# Patient Record
Sex: Male | Born: 2000 | Race: White | Hispanic: No | Marital: Single | Smoking: Never smoker
Health system: Southern US, Community
[De-identification: ages and names within clinical notes are randomized; demographics above are authoritative.]

## PROBLEM LIST (undated history)

## (undated) DIAGNOSIS — R569 Unspecified convulsions: Secondary | ICD-10-CM

## (undated) DIAGNOSIS — F840 Autistic disorder: Secondary | ICD-10-CM

## (undated) DIAGNOSIS — T4145XA Adverse effect of unspecified anesthetic, initial encounter: Secondary | ICD-10-CM

## (undated) DIAGNOSIS — Z9889 Other specified postprocedural states: Secondary | ICD-10-CM

## (undated) DIAGNOSIS — Q851 Tuberous sclerosis: Secondary | ICD-10-CM

## (undated) DIAGNOSIS — R112 Nausea with vomiting, unspecified: Secondary | ICD-10-CM

## (undated) DIAGNOSIS — T8859XA Other complications of anesthesia, initial encounter: Secondary | ICD-10-CM

## (undated) HISTORY — PX: MRI: SHX5353

## (undated) SURGERY — Surgical Case
Anesthesia: *Unknown

---
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
## 2018-02-15 ENCOUNTER — Telehealth (INDEPENDENT_AMBULATORY_CARE_PROVIDER_SITE_OTHER): Payer: Self-pay | Admitting: Pediatrics

## 2018-02-15 NOTE — Telephone Encounter (Signed)
Spoke with mom to get more information about Cameron Riley's seizures. She stated that he has had two significant seizures. She states that he grinds his teeth and eyes roll back and his shoulders tense up. They lasted about 45 seconds each and then he slept all night. While at school, he slept for 2 hours. She stated that he did have some blood work done January 13 at [facility] and his levels were fine. Please advise

## 2018-02-15 NOTE — Telephone Encounter (Signed)
As I hope you know, I am not able to contact the family because I am out of town.  See if we can obtain the laboratory that was done at [facility] and let the family know that I will call them when I return on Monday.  If this needs to be dealt with urgently, then Dr. Rogers Blocker will need to talk with the family.

## 2018-02-15 NOTE — Telephone Encounter (Signed)
°  Who's calling (name and relationship to patient) : [name] (Mother) Best contact number: [phone] Provider they see: Dr. Gaynell Face Reason for call: Mom called and would like to speak with Dr. Gaynell Face regarding seizures that her son has been having.

## 2018-02-21 NOTE — Telephone Encounter (Signed)
Cameron Riley had a pair of seizures on Wednesday.  He is lamotrigine level January 05, 2018 at trough was 21.7 mcg/mL which is in the toxic range for the laboratory (2.5-15.0 mcg/mL).  I do not feel comfortable increasing his dose.  Is been 6 months between seizures.  We are going to observe for now.  I have not seen him in a year and I asked his mother to bring him back in June.  We have more seizures were going to have to add another medication.

## 2018-02-21 NOTE — Telephone Encounter (Signed)
Patients mother called in regards to concerns from last week. Stated that she has been waiting for a call back and has not be contacted. She is requesting a call back as soon as possible.

## 2018-02-21 NOTE — Telephone Encounter (Signed)
Spoke with mom to inform her that we did get her message and that Dr. Gaynell Face will give her a call back. Also informed her that I could not find the blood work in [employer]. She is going to fax the results to [place].

## 2018-02-26 ENCOUNTER — Encounter (INDEPENDENT_AMBULATORY_CARE_PROVIDER_SITE_OTHER): Payer: Self-pay | Admitting: Pediatrics

## 2018-03-22 ENCOUNTER — Telehealth (INDEPENDENT_AMBULATORY_CARE_PROVIDER_SITE_OTHER): Payer: Self-pay | Admitting: Pediatrics

## 2018-03-22 NOTE — Telephone Encounter (Signed)
Forms have been placed on Dr. Hickling's desk 

## 2018-03-22 NOTE — Telephone Encounter (Signed)
Received fax from [place] [facility] requesting that the provider completes "[place] Medicaid Renewal Paperwork" for his incontinence supplies.  Once complete please fax to [phone] Document has been placed in providers basket up front

## 2018-05-25 ENCOUNTER — Other Ambulatory Visit (INDEPENDENT_AMBULATORY_CARE_PROVIDER_SITE_OTHER): Payer: Self-pay | Admitting: Family

## 2018-05-25 DIAGNOSIS — G40209 Localization-related (focal) (partial) symptomatic epilepsy and epileptic syndromes with complex partial seizures, not intractable, without status epilepticus: Secondary | ICD-10-CM

## 2018-06-22 ENCOUNTER — Telehealth (INDEPENDENT_AMBULATORY_CARE_PROVIDER_SITE_OTHER): Payer: Self-pay | Admitting: Family

## 2018-06-22 DIAGNOSIS — G40209 Localization-related (focal) (partial) symptomatic epilepsy and epileptic syndromes with complex partial seizures, not intractable, without status epilepticus: Secondary | ICD-10-CM

## 2018-06-22 MED ORDER — LAMICTAL 200 MG PO TABS: ORAL_TABLET | ORAL | 5 refills | Status: DC

## 2018-06-22 NOTE — Telephone Encounter (Signed)
Rx has been faxed to the pharmacy 

## 2018-07-25 ENCOUNTER — Telehealth (INDEPENDENT_AMBULATORY_CARE_PROVIDER_SITE_OTHER): Payer: Self-pay | Admitting: Pediatrics

## 2018-07-25 NOTE — Telephone Encounter (Signed)
°  Who's calling (name and relationship to patient) : [name] (Mother) Best contact number: [phone] Provider they see: Dr. Gaynell Face  Reason for call: Mom called to see if we received PA for pt's Lamictal.

## 2018-07-25 NOTE — Telephone Encounter (Signed)
PA has been approved and pharmacy has been notified.

## 2018-08-01 ENCOUNTER — Encounter (INDEPENDENT_AMBULATORY_CARE_PROVIDER_SITE_OTHER): Payer: Self-pay | Admitting: Pediatrics

## 2018-08-01 ENCOUNTER — Ambulatory Visit (INDEPENDENT_AMBULATORY_CARE_PROVIDER_SITE_OTHER): Payer: Medicaid Other | Admitting: Pediatrics

## 2018-08-01 VITALS — HR 108 | Ht 70.0 in | Wt 135.0 lb

## 2018-08-01 DIAGNOSIS — G40209 Localization-related (focal) (partial) symptomatic epilepsy and epileptic syndromes with complex partial seizures, not intractable, without status epilepticus: Secondary | ICD-10-CM | POA: Diagnosis not present

## 2018-08-01 DIAGNOSIS — Q851 Tuberous sclerosis: Secondary | ICD-10-CM | POA: Diagnosis not present

## 2018-08-01 DIAGNOSIS — F840 Autistic disorder: Secondary | ICD-10-CM | POA: Diagnosis not present

## 2018-08-01 MED ORDER — LAMICTAL 200 MG PO TABS: ORAL_TABLET | ORAL | 5 refills | Status: DC

## 2018-08-01 NOTE — Progress Notes (Signed)
Patient: Cameron Riley MRN: [id number] Sex: male DOB: [birth year]  Provider: Wyline Copas, MD Location of Care: [facility]  Note type: Routine return visit  History of Present Illness: Referral Source: Alvester Morin, MD History from: both parents, patient and [facility] chart Chief Complaint: Tuberous Sclerosis, Epilepsy, Autism Spectrum disorder  Cameron Riley is a 17 y.o. male who was evaluated on August 01, 2018 for the first time since January 20, 2017.  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  His last pair of seizures occurred on February 15, 2018.  During then, he was noted to be grinding his teeth, eyes rolled up, the shoulders were tense.  The episodes lasted 45 seconds and then he slept.  One happened at nighttime and the other during the day at school.  He had a lamotrigine level of 21.7 mcg/mL, which was in the toxic range for the [facility] laboratory.  Interestingly, there have been no seizures since that time.  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  It fortunately showed considerable shrinkage.  This was recently demonstrated on a CT scan of the abdomen under anesthesia.  Cameron Riley has been physically well.  He is sleeping fairly well.  His appetite is good.  His weight is stable.  He returns today for the first time in 18 months for routine visit.  This is a very difficult experience for Cameron Riley to get so many studies done in a short period of time under anesthesia as well as have his blood drawn.  His parents would like to defer any further imaging for a while.  I think that it would be reasonable given the stability of his condition to do so in the late spring or early summer 2020.  Review of Systems: A complete review of systems was assessed and was negative.  Past Medical  History Diagnosis Date  . Autism   . Complication of anesthesia   . PONV (postoperative nausea and vomiting)   . Seizures ([place])    last one 6 months ago as of 07/11/17  . TS (tuberous sclerosis) ([place])    Hospitalizations: No., Head Injury: No., Nervous System Infections: No., Immunizations up to date: Yes.    MRI May 08, 2014 shows 4 small enhancing sellar dependable nodules stable in size and configuration since 2012 largest near the right frontal horn and measures 6 mm.  Multiple small calcified developmental nodules and not significantly changed.  He has widespread bilateral subcortical white matter and cortical lesions representing tubers with increased T2 and flair hyperintensity.  CT scan of the abdomen without and with contrast shows a large right renal angiomyolipoma within the mid pole of the right kidney measuring 5 x 2.3 x 2.6 cm.  There are multiple small angiomyolipoma under centimeter in size in the right and left kidneys.  There was an indeterminate area of relative hypo-enhancement in the mid pole of the left kidney which may represent a small renal cell carcinoma.  His last chest x-ray in November 25, 2011 showed borderline cardiomegaly but no active disease.  MRI scan of the brain and a CT scan of the abdomen without and with contrast on July 13, 2017.  The MRI showed unchanged cortical tubers and small subependymal nodules.  The CT scan of the abdomen showed a large angiomyolipoma and numerous smaller ones.  The more recent study of July 05, 2018 showed considerable shrinkage of the angiomyolipoma  following embolization, but it still remains enlarged.  Behavior History This is spectrum disorder, elevate, hyperactivity, low frustration tolerance, agitation  Surgical History Procedure Laterality Date  . KIDNEY SURGERY  06/2014   Performed at [facility]  . MRI     history multiple MRIs  . RADIOLOGY WITH ANESTHESIA N/A 05/08/2014   Procedure: MRI;  Surgeon: Medication Radiologist,  MD;  Location: [facility];  Service: Radiology;  Laterality: N/A;  . RADIOLOGY WITH ANESTHESIA N/A 07/13/2017   Procedure: MRI BRAIN WITH AND WITHOUT CONTRAST, CT OF RENAL ABDOMIN WITH AND WITHOUT;  Surgeon: Radiologist, Medication, MD;  Location: [facility];  Service: Radiology;  Laterality: N/A;   Family History family history includes Heart disease in his paternal grandfather. Family history is negative for migraines, seizures, intellectual disabilities, blindness, deafness, birth defects, chromosomal disorder, or autism.  Social History Socioeconomic History  . Marital status: Single  . Years of education:  65  . Highest education level:  High school certificate  Occupational History  . Not on file  Social Needs  . Financial resource strain: Not on file  . Food insecurity:    Worry: Not on file    Inability: Not on file  . Transportation needs:    Medical: Not on file    Non-medical: Not on file  Tobacco Use  . Smoking status: Never Smoker  . Smokeless tobacco: Never Used  Substance and Sexual Activity  . Alcohol use: No    Alcohol/week: 0.0 standard drinks  . Drug use: No  . Sexual activity: Never  Social History Narrative    Cameron Riley is a 12th grade student.    He attends [employer]. He is doing well.     He lives with both parents and he has one brother who is 108 yo.     He enjoys swinging and being outside.    No Known Allergies  Physical Exam Pulse (!) 108   Ht 5\' 10"  (1.778 m)   Wt 135 lb (61.2 kg)   BMI 19.37 kg/m   General: alert, well developed, well nourished, in no acute distress, red hair, blue eyes, left handed Head: normocephalic, no dysmorphic features Ears, Nose and Throat: Otoscopic: tympanic membranes normal; pharynx: oropharynx is pink without exudates or tonsillar hypertrophy Neck: supple, full range of motion, no cranial or cervical bruits Respiratory: auscultation clear Cardiovascular: no murmurs, pulses are normal Musculoskeletal: no  skeletal deformities or apparent scoliosis Skin: no rashes, hemangioma under his right lower eyelid, multiple hypopigmented macules greater than 12 in total of his trunk and limbs, adenoma sedation on his cheek and patch in his lower trunk, left greater than right  Neurologic Exam  Mental Status: alert; nonverbal, restless has difficulty staying seated, occasionally combative when examined Cranial Nerves: visual fields are full to double simultaneous stimuli; extraocular movements are full and conjugate; pupils are round reactive to light; funduscopic examination shows bilateral positive red reflex with photophobia; symmetric facial strength; midline tongue; turns to localize sound bilaterally Motor: normal functional strength, tone and mass; clumsy fine motor movements Sensory: withdrawal x4 Coordination: good finger-to-nose, rapid repetitive alternating movements and finger apposition Gait and Station: slightly broad-based but stable gait and station: patient is able to walk on heels, toes and tandem without difficulty; balance is adequate; Romberg exam is negative; Gower response is negative Reflexes: symmetric and diminished bilaterally; no clonus; bilateral flexor plantar responses  Assessment 1. 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 2. Partial epilepsy with impairment  of consciousness, G40.209. 3. Autism spectrum disorder with accompanying intellectual impairment requiring very substantial support (level 3), F84.0.  Discussion Ashyr has been very stable.  I am pleased that there have been no seizures in nearly 6 months despite the fact that he did not change his medication.  If he has further seizures and his lamotrigine levels remain high, we may need to add a different medication.  I am reluctant to make changes because the seizures fortunately were infrequent.  I am also pleased that there has been no significant change in the tubers or subependymal nodules in his brain and that the embolization  seem to shrink the angiomyolipoma.  The question is whether medicines like tacrolimus would be effective in this situation.  I do not know enough about it to be able to give an opinion.  Plan I refilled his prescription for Lamictal trade drug.  He will return to see me in 9 months' time but I will see him sooner based on clinical need.  Greater than 50% of a 25 minute visit was spent in counseling and coordination of his care regarding his tuberous sclerosis and lesions in his brain and abdomen, his seizures, and his autism.   Medication List    Accurate as of 08/01/18  3:12 PM.      desmopressin 0.2 MG tablet Commonly known as:  DDAVP Take 0.6 mg by mouth at bedtime.   LAMICTAL 200 MG tablet Generic drug:  lamoTRIgine TAKE 2 AND 1/2 TABLETS BY MOUTH EACH MORNING AND 2 AND 1/2 TABLETS AT NIGHTTIME   Melatonin 5 MG Tabs Take 10 mg by mouth at bedtime.   risperiDONE 2 MG tablet Commonly known as:  RISPERDAL Take 3 mg po qAM, 4mg  po at 1:00pm, and 5mg  po qhs    The medication list was reviewed and reconciled. All changes or newly prescribed medications were explained.  A complete medication list was provided to the patient/caregiver.  Jodi Geralds MD

## 2018-12-20 ENCOUNTER — Telehealth (INDEPENDENT_AMBULATORY_CARE_PROVIDER_SITE_OTHER): Payer: Self-pay | Admitting: Pediatrics

## 2018-12-20 DIAGNOSIS — G40209 Localization-related (focal) (partial) symptomatic epilepsy and epileptic syndromes with complex partial seizures, not intractable, without status epilepticus: Secondary | ICD-10-CM

## 2018-12-20 MED ORDER — LAMICTAL 200 MG PO TABS: ORAL_TABLET | ORAL | 5 refills | Status: DC

## 2018-12-20 NOTE — Telephone Encounter (Signed)
Rx has been faxed to the pharmacy 

## 2019-07-03 ENCOUNTER — Ambulatory Visit (INDEPENDENT_AMBULATORY_CARE_PROVIDER_SITE_OTHER): Payer: Medicaid Other

## 2019-07-10 ENCOUNTER — Ambulatory Visit (INDEPENDENT_AMBULATORY_CARE_PROVIDER_SITE_OTHER): Payer: Medicaid Other

## 2019-08-01 ENCOUNTER — Ambulatory Visit (INDEPENDENT_AMBULATORY_CARE_PROVIDER_SITE_OTHER): Payer: Medicaid Other | Admitting: Pediatrics

## 2019-08-01 ENCOUNTER — Encounter (INDEPENDENT_AMBULATORY_CARE_PROVIDER_SITE_OTHER): Payer: Self-pay | Admitting: Pediatrics

## 2019-08-01 ENCOUNTER — Other Ambulatory Visit: Payer: Self-pay

## 2019-08-01 DIAGNOSIS — G40209 Localization-related (focal) (partial) symptomatic epilepsy and epileptic syndromes with complex partial seizures, not intractable, without status epilepticus: Secondary | ICD-10-CM

## 2019-08-01 DIAGNOSIS — Q851 Tuberous sclerosis: Secondary | ICD-10-CM | POA: Diagnosis not present

## 2019-08-01 DIAGNOSIS — F840 Autistic disorder: Secondary | ICD-10-CM | POA: Diagnosis not present

## 2019-08-01 MED ORDER — LAMICTAL 200 MG PO TABS: ORAL_TABLET | ORAL | 5 refills | Status: DC

## 2019-08-01 NOTE — Progress Notes (Signed)
This is a [facility] E-Visit follow up consult provided via [place] and mother Marciano Mundt (name of consenting adult) Location of patient: Bashir is at home Location of provider: [facility] Flemings [facility] Patient was referred by Jasmine December, NP   The following participants were involved in this E-Visit: Myriam Jacobson, mother, Dr. Gaynell Face  Chief Complaint/ Reason for E-Visit today: Tuberous Sclerosis, focal epilepsy with impairment of consciousness, Autism Spectrum Disorder, level 3 Total time on call: 25 minutes Follow up: 6 months   Patient: Dehaven Sine MRN: [id number] Sex: male DOB: October 29, 2001  Provider: Wyline Copas, MD Location of Care: [facility]  Note type: Routine return visit  History of Present Illness: Referral Source: Alvester Morin, MD History from: mother, patient and [facility] chart Chief Complaint: Tuberous Sclerosis/Epilepsy/Autism Spectrum Disorder  Montrez Marietta is a 18 y.o. male who was evaluated on August 01, 2019, for the first time since August 01, 2018.  The patient has tuberous sclerosis with level 3 autism and partial epilepsy with impairment of consciousness.  His MRI scan of the brain in 2018 shows 4 small enhancing subependymal nodules that were stable between 2012 and 2018.  He has multiple small calcified subependymal nodules that were stable.  He also has widespread bilateral subcortical white matter and cortical tubers with increased T2 and FLAIR signal.  CT scan of the abdomen showed a large right renal angiomyolipoma that was resected by Dr. Gari Crown of [facility].  Repeat study apparently showed that it was half the size that it had been.  He has had chest x-rays that are clear.  Since we have performed studies every 3 years, the next study would come up in 2021.  Hopefully, by that time, there will be less concern about using facilities at [facility]  with respect to COVID virus.  I already think that it is fairly safe, but since this is a big production requiring prolonged sedation for 3 different diagnostic modalities, there is no reason to perform it any sooner based on clinical findings.  Arion was evaluated virtually.  He was rather comfortable in his home.  His mother said that he goes to bed around 10 p.m., falls asleep 80 percent of the time and may take up to several hours 20 percent of the time.  He has rare arousals again about 20% of the time.  He gets up between 8 and 9 a.m.  This is his last school year.  It is unclear to me whether he will benefit from attending school, but the plans were for him to go on Mondays and Tuesdays.  He has a class size of 4.  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  His last seizure was well over a year ago.  His health is good.  His behavior is unchanged.  There are times that he can be erratic, impulsive, and irritable but for the most part, he does quite well and can be redirected.  Review of Systems: A complete review of systems was assessed and was negative except as noted above and below  Past Medical History Diagnosis Date   Autism    Complication of anesthesia    PONV (postoperative nausea and vomiting)    Seizures ([place])    last one 6 months ago as of 07/11/17   TS (tuberous sclerosis) ([place])    Hospitalizations: No., Head Injury: No., Nervous System Infections: No., Immunizations up to date: Yes.  Copied from prior chart MRI May 08, 2014 shows 4 small enhancing sellar dependable nodules stable in size and configuration since 2012 largest near the right frontal horn and measures 6 mm. Multiple small calcified developmental nodules and not significantly changed. He has widespread bilateral subcortical white matter and cortical lesions representing tubers with increased T2 and flairhyperintensity.  CT scan of the abdomen without and with contrast shows a large right renal angiomyolipoma  within the mid pole of the right kidney measuring 5 x 2.3 x 2.6 cm. There are multiple small angiomyolipoma under centimeter in size in the right and left kidneys. There was an indeterminate area of relative hypo-enhancement in the mid pole of the left kidney which may represent a small renal cell carcinoma.  His last chest x-ray in November 25, 2011 showed borderline cardiomegaly but no active disease.  MRI scan of the brain and a CT scan of the abdomen without and with contrast on July 13, 2017.  The MRI showed unchanged cortical tubers and small subependymal nodules.  The CT scan of the abdomen showed a large angiomyolipoma and numerous smaller ones.  The more recent study of July 05, 2018 showed considerable shrinkage of the angiomyolipoma  following embolization, but it still remains enlarged.  Behavior History Autism spectrum disorder, level 3, hyperactive, agitated  Surgical History Procedure Laterality Date   KIDNEY SURGERY  06/2014   Performed at [facility]   MRI     history multiple MRIs   RADIOLOGY WITH ANESTHESIA N/A 05/08/2014   Procedure: MRI;  Surgeon: Medication Radiologist, MD;  Location: [facility];  Service: Radiology;  Laterality: N/A;   RADIOLOGY WITH ANESTHESIA N/A 07/13/2017   Procedure: MRI BRAIN WITH AND WITHOUT CONTRAST, CT OF RENAL ABDOMIN WITH AND WITHOUT;  Surgeon: Radiologist, Medication, MD;  Location: [facility];  Service: Radiology;  Laterality: N/A;   Family History family history includes Heart disease in his paternal grandfather. Family history is negative for migraines, seizures, intellectual disabilities, blindness, deafness, birth defects, chromosomal disorder, or autism.  Social History Social History   Socioeconomic History   Marital status: Single   Years of education:  13   Highest education level:  High school certificate  Occupational History   Not employed due to disability  Social Needs   Financial resource strain: Not on file   Food  insecurity    Worry: Not on file    Inability: Not on file   Transportation needs    Medical: Not on file    Non-medical: Not on file  Tobacco Use   Smoking status: Never Smoker   Smokeless tobacco: Never Used  Substance and Sexual Activity   Alcohol use: No    Alcohol/week: 0.0 standard drinks   Drug use: No   Sexual activity: Never  Social History Narrative    Acea is a 12th grade student.    He attends [employer].      He lives with both parents and he has one brother who is 85 yo.     He enjoys swinging and being outside.    No Known Allergies  Physical Exam There were no vitals taken for this visit.  General: alert, well developed, well nourished, in no acute distress, red hair, blue eyes, left handed Head: normocephalic, no dysmorphic features Ears, Nose and Throat: Otoscopic: tympanic membranes normal; pharynx: oropharynx is pink without exudates or tonsillar hypertrophy Neck: supple, full range of motion, no cranial or cervical bruits Respiratory: auscultation clear Cardiovascular: no murmurs,  pulses are normal Musculoskeletal: no skeletal deformities or apparent scoliosis Skin: Hemangioma under his right lower eyelid, multiple hypopigmented macules, greater than 12 in total on his trunk and limbs, 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 and a shagren patch in his lower trunk left greater than right no rashes or neurocutaneous lesions  Neurologic Exam  Mental Status: alert; oriented to person; knowledge is below normal for age; language is limited, he has echolalia and is able to follow some simple commands; he was much more calm in his home setting than he is in our office Cranial Nerves: visual fields are full to double simultaneous stimuli; extraocular movements are full and conjugate;  symmetric facial strength; midline tongue; hearing appears normal bilaterally Motor: normal functional strength, tone and mass; clumsy fine motor  movements Coordination: no tremor Gait and Station: slightly broad-based but otherwise gait and station; balance is adequate; Romberg exam is negative; Gower response is negative  Assessment 1. Partial epilepsy with impairment of consciousness, G40.209. 2. 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 3. Autism spectrum disorder with accompanying intellectual impairment requiring very substantial support, level 3, F84.0.  Discussion I am pleased that Thomes is stable.    Plan I refilled his prescription for Lamictal, brand name medically necessary.  I would like to see him in 6 months, so that we can begin to plan his imaging for the summer of 2021.  I told his mother that we could continue virtual evaluations, although he is not able to cooperate as much as he would if I had him in the room.  Greater than 50% of a 25-minute visit was spent in counseling and coordination of care concerning his seizures, tuberous sclerosis, and autism.   Medication List   Accurate as of August 01, 2019  9:07 AM. If you have any questions, ask your nurse or doctor.    clonazePAM 1 MG tablet Commonly known as: KLONOPIN Take by mouth.   desmopressin 0.2 MG tablet Commonly known as: DDAVP Take 0.6 mg by mouth at bedtime.   LaMICtal 200 MG tablet Generic drug: lamoTRIgine TAKE 2 AND 1/2 TABLETS BY MOUTH EACH MORNING AND 2 AND 1/2 TABLETS AT NIGHTTIME   Melatonin 5 MG Tabs Take 10 mg by mouth at bedtime.   risperiDONE 2 MG tablet Commonly known as: RISPERDAL Take 3 mg po qAM, 4mg  po at 1:00pm, and 5mg  po qhs    The medication list was reviewed and reconciled. All changes or newly prescribed medications were explained.  A complete medication list was provided to the patient/caregiver.  Jodi Geralds MD

## 2019-08-01 NOTE — Patient Instructions (Signed)
It was good to see you today.  I am glad that Cameron Riley is doing well.  I refilled your prescription for Lamictal which was filled by my partner in January.  We have been checking his imaging studies every 3 years which is reasonable that would be 2021.  I would like to see you in February 2021 so that we can begin to make plans for the summer to have him imaged for his brain, chest x-ray, and CT scan of the abdomen.  Please let me know if there is anything that I can do for you between now and then.  I hope that he stays healthy by going to school.  We both understand that virtual learning for him is not possible.

## 2020-01-17 ENCOUNTER — Other Ambulatory Visit: Payer: Self-pay

## 2020-01-17 ENCOUNTER — Encounter (INDEPENDENT_AMBULATORY_CARE_PROVIDER_SITE_OTHER): Payer: Self-pay | Admitting: Pediatrics

## 2020-01-17 ENCOUNTER — Ambulatory Visit (INDEPENDENT_AMBULATORY_CARE_PROVIDER_SITE_OTHER): Payer: Medicaid Other | Admitting: Pediatrics

## 2020-01-17 VITALS — BP 118/76 | HR 76 | Ht 70.0 in | Wt 138.6 lb

## 2020-01-17 DIAGNOSIS — G40209 Localization-related (focal) (partial) symptomatic epilepsy and epileptic syndromes with complex partial seizures, not intractable, without status epilepticus: Secondary | ICD-10-CM

## 2020-01-17 DIAGNOSIS — F840 Autistic disorder: Secondary | ICD-10-CM | POA: Diagnosis not present

## 2020-01-17 DIAGNOSIS — Q851 Tuberous sclerosis: Secondary | ICD-10-CM | POA: Diagnosis not present

## 2020-01-17 DIAGNOSIS — G4700 Insomnia, unspecified: Secondary | ICD-10-CM

## 2020-01-17 MED ORDER — LAMICTAL 200 MG PO TABS: ORAL_TABLET | ORAL | 5 refills | Status: DC

## 2020-01-17 NOTE — Patient Instructions (Addendum)
I am glad the case is doing well, that is not having seizures and tolerating his medicine.  I am also glad that for the most part he is sleeping well.  I am not certain that I want to send him to come this summer to have his MR brain, CT abdomen, and chest x-ray under anesthesia.  We are going to have to see what is happening in [facility] and at home to determine if we think that it safe.  I am happy that he has yet another year school.  I think it is important for you to look at the adult care options for him when school is no longer an option.  Thanks for coming today.  Please keep in touch with me through MyChart if you got questions or concerns.  I refilled his prescription for Lamictal and will give it to you.  We will see you in 6 months.

## 2020-01-17 NOTE — Progress Notes (Signed)
Patient: Cameron Riley MRN: [id number] Sex: male DOB: 2001-05-02  Provider: Wyline Copas, MD Location of Care: [facility]  Note type: Routine return visit  History of Present Illness: Referral Source: Alvester Morin, MD History from: both parents, patient and [facility] chart Chief Complaint: Tuberous sclerosis/Epilepsy/Autism  Cameron Riley is a 19 y.o. male who returns January 17, 2020 for the first time since August 01, 2019.  He has Tuberous Sclerosis, level 3 autism, and partial epilepsy with impairment of consciousness.  He has not received any screening test since 2018 and if possible needs for that to happen this summer..  We had him come to the office without a mask because we knew he would not wear it.  He was relatively comfortable in the office did not appear to be frightened.  He was fairly cooperative for examination and showed some echolalia.  He goes to bed between 9 and 10 PM and sleeps soundly until 8:52 AM.  He goes to [employer] 2 days a week for the whole day and rides the bus.  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  His general health is good.  Nobody in the family has contracted Covid.  His parents had no other concerns today.  Review of Systems: A complete review of systems was remarkable for patient is here to be seen for autism, epilepsy, and tuberous sclerosis. MOm reports that the patient has not had any seizures since his last visit. She staets that he has been doing well. She has no other concerns at this time., all other systems reviewed and negative.  Past Medical History Diagnosis Date  . Autism   . Complication of anesthesia   . PONV (postoperative nausea and vomiting)   . Seizures ([place])    last one 6 months ago as of 07/11/17  . TS (tuberous sclerosis) ([place])    Hospitalizations: No., Head  Injury: No., Nervous System Infections: No., Immunizations up to date: Yes.    MRI scan of the brain in 2018 shows 4 small enhancing subependymal nodules that were stable between 2012 and 2018.  He has multiple small calcified subependymal nodules that were stable.  He also has widespread bilateral subcortical white matter and cortical tubers with increased T2 and FLAIR signal.  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 Gari Crown of [facility].  Repeat study July 05, 2018 showed that it was half the size that it had been.    His chest x-rays is clear.  MRI May 08, 2014 shows 4 small enhancing sellar dependable nodules stable in size and configuration since 2012 largest near the right frontal horn and measures 6 mm. Multiple small calcified developmental nodules and not significantly changed. He has widespread bilateral subcortical white matter and cortical lesions representing tubers with increased T2 and flairhyperintensity.  CT scan of the abdomen without and with contrast shows a large right renal angiomyolipoma within the mid pole of the right kidney measuring 5 x 2.3 x 2.6 cm. There are multiple small angiomyolipoma under centimeter in size in the right and left kidneys. There was an indeterminate area of relative hypo-enhancement in the mid pole of the left kidney which may represent a small renal cell carcinoma.  Behavior History Autism spectrum disorder, level 3,  hyperactive, agitatation  Surgical History Procedure Laterality Date  . KIDNEY SURGERY  06/2014   Performed at [facility]  . MRI     history multiple MRIs  . RADIOLOGY WITH ANESTHESIA N/A 05/08/2014   Procedure: MRI;  Surgeon: Medication Radiologist, MD;  Location: [place];  Service: Radiology;  Laterality: N/A;  . RADIOLOGY WITH ANESTHESIA N/A 07/13/2017   Procedure: MRI BRAIN WITH AND WITHOUT CONTRAST, CT OF RENAL ABDOMIN WITH AND WITHOUT;  Surgeon: Radiologist,  Medication, MD;  Location: [place];  Service: Radiology;  Laterality: N/A;   Family History family history includes Heart disease in his paternal grandfather. Family history is negative for migraines, seizures, intellectual disabilities, blindness, deafness, birth defects, chromosomal disorder, or autism.  Social History Socioeconomic History  . Marital status: Single  . Years of education:  39  . Highest education level:  High school certificate  Occupational History  . Not employed  Tobacco Use  . Smoking status: Never Smoker  . Smokeless tobacco: Never Used  Substance and Sexual Activity  . Alcohol use: No    Alcohol/week: 0.0 standard drinks  . Drug use: No  . Sexual activity: Never  Social History Narrative    Domani is a 12th grade student.    He attends [employer].      He lives with both parents and he has one brother who is 62 yo.     He enjoys swinging and being outside.    No Known Allergies  Physical Exam BP 118/76   Pulse 76   Ht 5\' 10"  (1.778 m)   Wt 138 lb 9.6 oz (62.9 kg)   BMI 19.89 kg/m   General: alert, well developed, well nourished, in no acute distress, red hair, blue eyes, left handed Head: normocephalic, no dysmorphic features Ears, Nose and Throat: Otoscopic: tympanic membranes normal; pharynx: oropharynx is pink without exudates or tonsillar hypertrophy Neck: supple, full range of motion, no cranial or cervical bruits Respiratory: auscultation clear Cardiovascular: no murmurs, pulses are normal Musculoskeletal: no skeletal deformities or apparent scoliosis Skin: hemangioma under his right lower eyelid, multiple hypopigmented macules, greater than 12 in total on his trunk and limbs, 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 and a shagren patch in his lower trunk left greater than right; no rashes  Neurologic Exam  Mental Status: alert; oriented to person; knowledge is below normal for age; language is limited with significant echolalia,  able to follow some simple commands; expresses his constant thought of wanting chicken Cranial Nerves: visual fields are full to double simultaneous stimuli; extraocular movements are full and conjugate; pupils are round reactive to light; funduscopic examination shows bilateral positive red reflex; symmetric impassive facial strength; midline tongue; hearing appears to be normal because he follows my commands, but he does not localize sound bilaterally Motor: normal functional strength, tone and mass; good fine motor movements Sensory: withdrawal x4 Coordination: no tremor Gait and Station: slightly broad-based gait and station Reflexes: symmetric and diminished bilaterally; no clonus; bilateral flexor plantar responses  Assessment 1.  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 2.  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 3.  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  Discussion Tegh is medically neurologically stable.  There is no reason to change his treatment.  Plan I refilled his prescription for ligamental, brand-name medically necessary.  I would like him to return in 6 months.  Greater than 50% of a 25-minute visit  was spent in counseling and coordination of care concerning his tuberosclerosis, autism, and seizure disorder.  We will discuss imaging his brain abdomen, and chest x-ray later this spring as it becomes more clear whether coronavirus will be in better control in our community.   Medication List   Accurate as of January 17, 2020  8:59 AM. If you have any questions, ask your nurse or doctor.    clonazePAM 1 MG tablet Commonly known as: KLONOPIN Take by mouth.   desmopressin 0.2 MG tablet Commonly known as: DDAVP Take 0.6 mg by mouth at bedtime.   LaMICtal 200 MG tablet Generic drug: lamoTRIgine TAKE 2 AND 1/2 TABLETS BY MOUTH EACH MORNING AND 2 AND 1/2  TABLETS AT NIGHTTIME   Melatonin 5 MG Tabs Take 10 mg by mouth at bedtime.   risperiDONE 2 MG tablet Commonly known as: RISPERDAL Take 3 mg po qAM, 4mg  po at 1:00pm, and 5mg  po qhs    The medication list was reviewed and reconciled. All changes or newly prescribed medications were explained.  A complete medication list was provided to the patient/caregiver.  Jodi Geralds MD

## 2020-01-31 ENCOUNTER — Ambulatory Visit (INDEPENDENT_AMBULATORY_CARE_PROVIDER_SITE_OTHER): Payer: Medicaid Other | Admitting: Pediatrics

## 2020-02-17 ENCOUNTER — Other Ambulatory Visit (INDEPENDENT_AMBULATORY_CARE_PROVIDER_SITE_OTHER): Payer: Self-pay | Admitting: Pediatrics

## 2020-02-17 DIAGNOSIS — G40209 Localization-related (focal) (partial) symptomatic epilepsy and epileptic syndromes with complex partial seizures, not intractable, without status epilepticus: Secondary | ICD-10-CM

## 2020-02-17 NOTE — Telephone Encounter (Signed)
Please send to the pharmacy °

## 2020-06-04 ENCOUNTER — Encounter (INDEPENDENT_AMBULATORY_CARE_PROVIDER_SITE_OTHER): Payer: Self-pay | Admitting: Pediatrics

## 2020-06-04 ENCOUNTER — Telehealth (INDEPENDENT_AMBULATORY_CARE_PROVIDER_SITE_OTHER): Payer: Self-pay | Admitting: Pediatrics

## 2020-06-04 NOTE — Telephone Encounter (Signed)
Left message to schedule a follow up in July. A letter was also sent

## 2020-08-10 ENCOUNTER — Other Ambulatory Visit (INDEPENDENT_AMBULATORY_CARE_PROVIDER_SITE_OTHER): Payer: Self-pay | Admitting: Pediatrics

## 2020-08-10 DIAGNOSIS — G40209 Localization-related (focal) (partial) symptomatic epilepsy and epileptic syndromes with complex partial seizures, not intractable, without status epilepticus: Secondary | ICD-10-CM

## 2020-08-10 NOTE — Telephone Encounter (Signed)
Please send to the pharmacy °

## 2020-08-18 ENCOUNTER — Other Ambulatory Visit: Payer: Self-pay

## 2020-08-18 ENCOUNTER — Ambulatory Visit (INDEPENDENT_AMBULATORY_CARE_PROVIDER_SITE_OTHER): Payer: Medicaid Other | Admitting: Pediatrics

## 2020-08-18 ENCOUNTER — Encounter (INDEPENDENT_AMBULATORY_CARE_PROVIDER_SITE_OTHER): Payer: Self-pay | Admitting: Pediatrics

## 2020-08-18 VITALS — Ht 71.0 in | Wt 140.0 lb

## 2020-08-18 DIAGNOSIS — G40209 Localization-related (focal) (partial) symptomatic epilepsy and epileptic syndromes with complex partial seizures, not intractable, without status epilepticus: Secondary | ICD-10-CM

## 2020-08-18 DIAGNOSIS — G4700 Insomnia, unspecified: Secondary | ICD-10-CM

## 2020-08-18 DIAGNOSIS — F840 Autistic disorder: Secondary | ICD-10-CM

## 2020-08-18 DIAGNOSIS — Q851 Tuberous sclerosis: Secondary | ICD-10-CM

## 2020-08-18 MED ORDER — LAMICTAL 200 MG PO TABS: ORAL_TABLET | ORAL | 5 refills | Status: DC

## 2020-08-18 NOTE — Progress Notes (Signed)
Patient: Cameron Riley MRN: [id number] Sex: male DOB: July 27, 2001  Provider: Wyline Copas, MD Location of Care: [facility]  Note type: Routine return visit  History of Present Illness: Referral Source: Alvester Morin, MD History from: both parents, patient and [facility] chart Chief Complaint: Tuberous Sclerosis/Epilepsy/Autism  Cameron Riley is a 19 y.o. male who returns August 18, 2020 for the first time since January 17, 2020 for evaluation and treatment of Tuberous Sclerosis, epilepsy, and autism.  As best I know, we have not performed genetic testing, but Cameron Riley has all the cutaneous stigmata plus an MRI scan that shows subtle enhancing subependymoma nodules and widespread bilateral subcortical white matter and cortical tubers.  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  His last imaging studies were July 13, 2017.  Covid has delayed repeat studies.  [name]'s seizures have been completely controlled.  His health is good.  No one in the family has contracted Covid.  Both parents are vaccinated.  They do not believe that he can be held down to be vaccinated.  He is followed by me for his neurologic conditions.  He is followed at [facility] for his behavior.  There is a Engineer, agricultural who comes to home 50 hours/week so that both parents can work.  He attends school 3 days a week at [employer].  He goes to bed between 8 and 9 PM sometimes later and sleeps until 10 or 11 AM.  Review of Systems: A complete review of systems was remarkable for patient is here to be seen for tuberous sclerosis, epilepsy, and autism. Mom reports that the patient has not had any seizures since his last visit. She reports that she has no concenrs at this time., all other systems reviewed and negative.  Past Medical History Diagnosis Date  . Autism   . Complication of anesthesia   . PONV (postoperative nausea and vomiting)   . Seizures ([place])     last one 6 months ago as of 07/11/17  . TS (tuberous sclerosis) ([place])    Hospitalizations: No., Head Injury: No., Nervous System Infections: No., Immunizations up to date: Yes.    Copied from prior chart notes MRI scan of the brainin 2018shows 4 small enhancing subependymal nodules that were stable between 2012 and 2018. He has multiple small calcified subependymal nodules that were stable. He also has widespread bilateral subcortical white matter and cortical tubers with increased T2 and FLAIR signal.  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 Gari Crown of [facility]. Repeat study July 05, 2018 showed that it was half the size that it had been.   His chest x-rays is clear.  MRI May 08, 2014 shows 4 small enhancing sellar dependable nodules stable in size and configuration since 2012 largest near the right frontal horn and measures 6 mm. Multiple small calcified developmental nodules and not significantly changed. He has widespread bilateral subcortical white matter and cortical lesions representing tubers with increased T2 and flairhyperintensity.  CT scan of the abdomen without and with contrast shows a large right renal angiomyolipoma within the mid pole of the right kidney measuring 5 x 2.3 x 2.6 cm. There are multiple small angiomyolipoma under centimeter in size in the right and left kidneys. There was an indeterminate area of relative hypo-enhancement in the mid pole of the left kidney which may represent a small renal cell carcinoma.  Behavior History  Autism spectrum disorder, level 3, hyperactive, agitatation  Surgical History Procedure Laterality Date  . KIDNEY SURGERY  06/2014   Performed at [facility]  . MRI     history multiple MRIs  . RADIOLOGY WITH ANESTHESIA N/A 05/08/2014   Procedure: MRI;  Surgeon: Medication Radiologist, MD;  Location: [place];  Service: Radiology;  Laterality: N/A;  . RADIOLOGY WITH  ANESTHESIA N/A 07/13/2017   Procedure: MRI BRAIN WITH AND WITHOUT CONTRAST, CT OF RENAL ABDOMIN WITH AND WITHOUT;  Surgeon: Radiologist, Medication, MD;  Location: [place];  Service: Radiology;  Laterality: N/A;   Family History family history includes Heart disease in his paternal grandfather. Family history is negative for migraines, seizures, intellectual disabilities, blindness, deafness, birth defects, chromosomal disorder, or autism.  Social History Socioeconomic History  . Marital status: Single  . Years of education:  68  . Highest education level:  High school certificate  Occupational History  . Not employed  Tobacco Use  . Smoking status: Never Smoker  . Smokeless tobacco: Never Used  Vaping Use  . Vaping Use: Never used  Substance and Sexual Activity  . Alcohol use: No    Alcohol/week: 0.0 standard drinks  . Drug use: No  . 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    He attends [employer].      He lives with both parents and he has one brother who is 68 yo.     He enjoys swinging and being outside.    No Known Allergies  Physical Exam Ht 5\' 11"  (1.803 m)   Wt 140 lb (63.5 kg)   BMI 19.53 kg/m   General: alert, well developed, well nourished, in no acute distress, red hair, blue eyes, left glasses handed Head: normocephalic, no dysmorphic features Ears, Nose and Throat: Otoscopic: tympanic membranes normal; pharynx: oropharynx is pink without exudates or tonsillar hypertrophy Neck: supple, full range of motion, no cranial or cervical bruits Respiratory: auscultation clear Cardiovascular: no murmurs, pulses are normal Musculoskeletal: no skeletal deformities or apparent scoliosis Skin: hemangioma under his right lower eyelid, multiple hypopigmented macules, greater than 12 in total on his trunk and limbs, 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 and ashagrenpatch in his lower trunk left greater than  right;  Neurologic Exam  Mental Status: alert; oriented to person; knowledge is below normal for age; language is limited with echolalia and difficulty conveying his thoughts; he is able to follow some simple commands Cranial Nerves: visual fields are full to double simultaneous stimuli; extraocular movements are full and conjugate; pupils are round reactive to light; funduscopic examination shows bilateral red reflex; symmetric facial strength; midline tongue and uvula; air conduction is greater than bone conduction bilaterally Motor: normal functional strength, tone and mass; good fine motor movements; no pronator drift Sensory: withdraws x4 Coordination: good finger-to-nose, rapid repetitive alternating movements and finger apposition Gait and Station: Broad-based but stable gait and station Reflexes: symmetric and diminished bilaterally; no clonus; bilateral flexor plantar responses  Assessment 1.  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 2.  Focal epilepsy with impairment of consciousness, G40.209. 3.  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  Discussion I am pleased to [facility] seizures under control.  I am concerned that its been over 3 years since we imaged his brain, chest, and kidneys.  The present I do not think that we would be allowed to do procedure requiring conscious  sedation with anesthesia in order to complete the studies.  Plan He will return to see me in 6 months' time.  I have informed the family that I will retire September 17, 2021 and that his care will be transitioned my partner Dr. Franco Nones.  We will make transition at his next visit and hopefully set up his next imaging studies.  Greater than 50% of 25-minute visit was spent in counseling and coordination of care concerning management of his epilepsy, autism, and Tuberous Sclerosis.  A prescription for Lamictal was  written and sent with the parents.   Medication List   Accurate as of August 18, 2020  4:07 PM. If you have any questions, ask your nurse or doctor.    clonazePAM 1 MG tablet Commonly known as: KLONOPIN Take by mouth.   desmopressin 0.2 MG tablet Commonly known as: DDAVP Take 0.6 mg by mouth at bedtime.   LaMICtal 200 MG tablet Generic drug: lamoTRIgine TAKE 2 AND 1/2 TABLETS BY MOUTH EACH MORNING AND EACH NIGHT AT BEDTIME   melatonin 5 MG Tabs Take 10 mg by mouth at bedtime.   risperiDONE 2 MG tablet Commonly known as: RISPERDAL Take 3 mg po qAM, 4mg  po at 1:00pm, and 5mg  po qhs    The medication list was reviewed and reconciled. All changes or newly prescribed medications were explained.  A complete medication list was provided to the patient/caregiver.  Jodi Geralds MD

## 2020-08-18 NOTE — Patient Instructions (Signed)
It was a pleasure to see you today.  I am pleased that he has not had any seizures and that he seems to be so well.  I want to get him evaluated with imaging of his brain chest and abdomen but I think it is going to have to wait another 6 months to see how Covid is affecting our institution.  As I told you I am planning on retiring in September 2022.  6 months from now I will have a joint visit with my partner Imane Abdelmoumen and you so that we can begin to make a transfer of care.  Please let me know if there is anything that you need between now and when we see you again.

## 2021-03-12 ENCOUNTER — Ambulatory Visit (INDEPENDENT_AMBULATORY_CARE_PROVIDER_SITE_OTHER): Payer: Medicaid Other | Admitting: Pediatrics

## 2021-04-12 ENCOUNTER — Encounter (INDEPENDENT_AMBULATORY_CARE_PROVIDER_SITE_OTHER): Payer: Self-pay | Admitting: Pediatrics

## 2021-04-12 ENCOUNTER — Ambulatory Visit (INDEPENDENT_AMBULATORY_CARE_PROVIDER_SITE_OTHER): Payer: Medicaid Other | Admitting: Pediatrics

## 2021-04-12 ENCOUNTER — Other Ambulatory Visit: Payer: Self-pay

## 2021-04-12 VITALS — Ht 71.0 in | Wt 145.8 lb

## 2021-04-12 DIAGNOSIS — G40209 Localization-related (focal) (partial) symptomatic epilepsy and epileptic syndromes with complex partial seizures, not intractable, without status epilepticus: Secondary | ICD-10-CM

## 2021-04-12 DIAGNOSIS — Q851 Tuberous sclerosis: Secondary | ICD-10-CM | POA: Diagnosis not present

## 2021-04-12 DIAGNOSIS — F840 Autistic disorder: Secondary | ICD-10-CM

## 2021-04-12 MED ORDER — LAMICTAL 200 MG PO TABS: ORAL_TABLET | ORAL | 5 refills | Status: DC

## 2021-04-12 NOTE — Patient Instructions (Signed)
We will arrange to have this done under sedation and any blood tests that are needed you need to send to me as soon as possible so that I can include them in her request.  There is no reason to change his Lamictal.  Will see him again in 6 months.  If he needs to be seen before September 30 I will be happy to do it.  He will probably be followed by Dr. Carylon Perches.

## 2021-04-13 NOTE — Progress Notes (Signed)
Patient: [facility] MRN: [id number] Sex: male DOB: December 06, 2001  Provider: Wyline Copas, MD Location of Care: [facility]  Note type: Routine return visit  History of Present Illness: Referral Source: Cameron Morin, MD History from: both parents and [facility] chart Chief Complaint: Tuberous Sclerosis/epilepsy/autism  C[facility] is a 20 y.o. male who was evaluated April 12, 2021 for the first time since August 18, 2020.  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  I do not think that he has received genetic testing.  He has adenoma sebaceum, hemangioma underneath his right eyelid, multiple hypopigmented macules, and a Shagren patch on his lower trunk left greater than right.  In addition he has an MRI scan.  In addition he has small for enhancing sellar ependymal nodules which have been stable multiple calcified ependymal nodules widespread bilateral subcortical white matter and cortical tubers with increased T2 and flair hyperintensity.  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  He has not had any imaging since 2018.  In part this is related to COVID.  His general health is good.  He is sleeping well.  His weight is stable.  He continues to have nocturnal enuresis that was not controlled with large doses of DDAVP.  He is on oxybutynin at this time was steadily increasing doses that I do not think is going to work because I do not believe that this is a matter of bladder tone.  His general health is good.  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  He has a behavioral health specialist that is virtual every 4 months at [facility] and a urologist.  Review of Systems: A complete review of systems was assessed and was negative except as noted  above in history of present illness.  Past Medical History Diagnosis Date  . Autism   . Complication of anesthesia   . PONV (postoperative nausea and vomiting)   . Seizures ([place])    last one 6 months ago as of 07/11/17  . TS (tuberous sclerosis) ([place])    Hospitalizations: No., Head Injury: No., Nervous System Infections: No., Immunizations up to date: Yes.    Copied from prior chart notes MRI scan of the brainin 2018shows 4 small enhancing subependymal nodules that were stable between 2012 and 2018. He has multiple small calcified subependymal nodules that were stable. He also has widespread bilateral subcortical white matter and cortical tubers with increased T2 and FLAIR signal.  CT scan of the abdomen showed a large right renal angiomyolipoma that wastreated with embolizationby Dr. Gari Crown of [facility]. Repeat study July 05, 2018 showed that it was half the size that it had been.   Hischest x-rays areclear.  MRI May 08, 2014 shows 4 small enhancing sellar dependable nodules stable in size and configuration since 2012 largest near the right frontal horn and measures 6 mm. Multiple small calcified developmental nodules and not significantly changed. He has widespread bilateral subcortical white matter and cortical lesions representing tubers with increased T2 and flairhyperintensity.  CT scan of the abdomen without and with contrast shows a large right renal angiomyolipoma within the mid pole of the right kidney measuring 5 x 2.3 x 2.6 cm. There are multiple small angiomyolipoma under centimeter in size in the right and left kidneys. There  was an indeterminate area of relative hypo-enhancement in the mid pole of the left kidney which may represent a small renal cell carcinoma.  Behavior History Autism spectrum disorder, level 3, hyperactive, agitatation  Surgical History Procedure Laterality Date  . KIDNEY SURGERY  06/2014   Performed at [facility]  . MRI     history  multiple MRIs  . RADIOLOGY WITH ANESTHESIA N/A 05/08/2014   Procedure: MRI;  Surgeon: Medication Radiologist, MD;  Location: [facility];  Service: Radiology;  Laterality: N/A;  . RADIOLOGY WITH ANESTHESIA N/A 07/13/2017   Procedure: MRI BRAIN WITH AND WITHOUT CONTRAST, CT OF RENAL ABDOMIN WITH AND WITHOUT;  Surgeon: Radiologist, Medication, MD;  Location: [facility];  Service: Radiology;  Laterality: N/A;   Family History family history includes Heart disease in his paternal grandfather. Family history is negative for migraines, seizures, intellectual disabilities, blindness, deafness, birth defects, chromosomal disorder, or autism.  Social History Socioeconomic History  . Marital status: Single  . Years of education:  13+  . Highest education level:  High school senior  Occupational History  . Not employed  Tobacco Use  . Smoking status: Never Smoker  . Smokeless tobacco: Never Used  Vaping Use  . Vaping Use: Never used  Substance and Sexual Activity  . Alcohol use: No    Alcohol/week: 0.0 standard drinks  . Drug use: No  . 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    He attends [employer].      He lives with both parents and he has one brother who is 5 yo.     He enjoys swinging and being outside.    No Known Allergies  Physical Exam Ht 5\' 11"  (1.803 m)   Wt 145 lb 12.8 oz (66.1 kg)   BMI 20.33 kg/m   General: alert, well developed, well nourished, in no acute distress, red hair, blue eyes, left handed Head: normocephalic, no dysmorphic features Ears, Nose and Throat: Otoscopic: tympanic membranes normal; pharynx: oropharynx is pink without exudates or tonsillar hypertrophy Neck: supple, full range of motion, no cranial or cervical bruits Respiratory: auscultation clear Cardiovascular: no murmurs, pulses are normal Musculoskeletal: no skeletal deformities or apparent scoliosis Skin: hemangioma under his right lower  eyelid, multiple hypopigmented macules, greater than 12 in total on his trunk and limbs, 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 and ashagrenpatch in his lower trunk left greater than right;  Neurologic Exam  Mental Status: alert; oriented to person; knowledge is below normal for age; language is limited primarily to echolalia.  He has difficulty conveying all that a few simple thoughts.  He Saying the word spaghetti because he had been promised that when he went home for dinner tonight.  He is able to follow some simple commands that in part depends on his mood.  He was happy and not agitated today Cranial Nerves: visual fields are full to double simultaneous stimuli; extraocular movements are full and conjugate; pupils are round reactive to light; funduscopic examination shows bilateral positive red reflex; symmetric, impassive facial strength; midline tongue; localizes sound bilaterally Motor: normal functional strength, tone and mass; good fine motor movements; unable to test pronator drift Sensory: withdrawal x4 Coordination: No tremor Gait and Station: Broad-based but stable gait and station Reflexes: symmetric and normal at the knees and ankles diminished bilaterally in the upper extremities; no clonus; bilateral flexor plantar responses  Assessment 1.  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 2.  Focal epilepsy with impairment  of consciousness, G40.209. 3.  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  Discussion [place] is neurologically and physically stable.  There is no reason to change his current medications.  Plan I will order chest x-ray, MRI scan of the brain without with contrast and CT scan of the abdomen and pelvis.  This will need to be done under general anesthesia.  His other physicians want some blood work drawn after sedation which I will try to arrange.  Greater than 50% of a 30-minute  visit was spent in counseling and coordination of care concerning his medical and neurologic conditions transition of care.  He will stay in this practice and be seen again in about 6 months.  Obviously I will see him sooner depending upon the results of his imaging studies.  We will try very arrange for that in June or July.  Prescription was refilled for Lamictal trade drug.   Medication List   Accurate as of April 12, 2021 11:59 PM. If you have any questions, ask your nurse or doctor.      TAKE these medications   clonazePAM 1 MG tablet Commonly known as: KLONOPIN Take by mouth.   desmopressin 0.2 MG tablet Commonly known as: DDAVP Take 0.6 mg by mouth at bedtime.   LaMICtal 200 MG tablet Generic drug: lamoTRIgine TAKE 2 AND 1/2 TABLETS BY MOUTH EACH MORNING AND EACH NIGHT AT BEDTIME   oxybutynin 10 MG 24 hr tablet Commonly known as: DITROPAN-XL Take by mouth.   risperiDONE 2 MG tablet Commonly known as: RISPERDAL Take 3 mg po qAM and 4mg  po qhs    The medication list was reviewed and reconciled. All changes or newly prescribed medications were explained.  A complete medication list was provided to the patient/caregiver.  Jodi Geralds MD

## 2021-04-22 ENCOUNTER — Encounter (INDEPENDENT_AMBULATORY_CARE_PROVIDER_SITE_OTHER): Payer: Self-pay

## 2021-04-27 ENCOUNTER — Telehealth (HOSPITAL_COMMUNITY): Payer: Self-pay

## 2021-04-27 ENCOUNTER — Encounter (INDEPENDENT_AMBULATORY_CARE_PROVIDER_SITE_OTHER): Payer: Self-pay | Admitting: *Deleted

## 2021-04-28 ENCOUNTER — Telehealth (HOSPITAL_COMMUNITY): Payer: Self-pay

## 2021-04-28 ENCOUNTER — Telehealth (INDEPENDENT_AMBULATORY_CARE_PROVIDER_SITE_OTHER): Payer: Self-pay | Admitting: Pediatrics

## 2021-04-28 NOTE — Telephone Encounter (Signed)
  Who's calling (name and relationship to patient) :Hiram Comber with [facility] Radiology Dept.   Best contact [phone]  Provider they see:Dr. Gaynell Face   Reason for call:Carey called requesting a call back about the MRI and CT that is scheduled that needs Anesthesia. There is a required  COVID  Test that will need to be done before  but mom stated that he will not take the COVID test. Hiram Comber has requested a call back to discuss this matter. Please advise     PRESCRIPTION REFILL ONLY  Name of prescription:  Pharmacy:

## 2021-04-28 NOTE — Telephone Encounter (Signed)
Spoke with Dr. Gaynell Face and he is in agreement with the patient not cooperating in order to get this done. He stated that since this is still a requirement at [facility], the MRI will have to wait until the Covid test is not required

## 2021-04-28 NOTE — Telephone Encounter (Signed)
Called to speak with [facility]. She is working remote so she will give me a callback.

## 2021-06-04 ENCOUNTER — Encounter (INDEPENDENT_AMBULATORY_CARE_PROVIDER_SITE_OTHER): Payer: Self-pay

## 2021-06-29 ENCOUNTER — Telehealth (INDEPENDENT_AMBULATORY_CARE_PROVIDER_SITE_OTHER): Payer: Self-pay | Admitting: Pediatrics

## 2021-06-30 ENCOUNTER — Other Ambulatory Visit: Payer: Self-pay

## 2021-06-30 ENCOUNTER — Encounter (HOSPITAL_COMMUNITY): Payer: Self-pay | Admitting: Pediatrics

## 2021-06-30 NOTE — Telephone Encounter (Signed)
Apparently everything is in order for the imaging and lab studies.

## 2021-06-30 NOTE — Progress Notes (Signed)
PCP - Jasmine December, NP Cardiologist - denies  EKG -  Chest x-ray -  ECHO -  Cardiac Cath -  CPAP -   COVID TEST- n/a  Anesthesia review: seizures   -------------  SDW INSTRUCTIONS:  Your procedure is scheduled on 7/14 Thursday. Please report to [facility] Main Entrance "A" at 0730 A.M., and check in at the Admitting office. Call this number if you have problems the morning of surgery: [phone]   Remember: Do not eat or drink after midnight the night before your surgery   Medications to take morning of surgery with a sip of water include: clonazePAM (KLONOPIN)  LAMICTAL risperiDONE (RISPERDAL)   As of today, STOP taking any Aspirin (unless otherwise instructed by your surgeon), Aleve, Naproxen, Ibuprofen, Motrin, Advil, Goody's, BC's, all herbal medications, fish oil, and all vitamins.    The Morning of Surgery Do not wear jewelry, make-up or nail polish. Do not wear lotions, powders, colognes, or deodorant Men may shave face and neck. Do not bring valuables to the hospital. [facility] is not responsible for any belongings or valuables.  If you are a smoker, DO NOT Smoke 24 hours prior to surgery If you wear a CPAP at night please bring your mask the morning of surgery  Remember that you must have someone to transport you home after your surgery, and remain with you for 24 hours if you are discharged the same day.  Please bring cases for contacts, glasses, hearing aids, dentures or bridgework because it cannot be worn into surgery.   Patients discharged the day of surgery will not be allowed to drive home.   Please shower the NIGHT BEFORE/MORNING OF SURGERY (use antibacterial soap like DIAL soap if possible). Wear comfortable clothes the morning of surgery. Oral Hygiene is also important to reduce your risk of infection.  Remember - BRUSH YOUR TEETH THE MORNING OF SURGERY WITH YOUR REGULAR TOOTHPASTE  Patient denies shortness of breath, fever, cough and chest pain.

## 2021-07-01 ENCOUNTER — Ambulatory Visit (HOSPITAL_COMMUNITY)
Admission: RE | Admit: 2021-07-01 | Discharge: 2021-07-01 | Disposition: A | Discharge status: Home / Self Care | Payer: Medicaid Other | Source: Ambulatory Visit | Attending: Pediatrics | Admitting: Pediatrics

## 2021-07-01 ENCOUNTER — Encounter (HOSPITAL_COMMUNITY): Payer: Self-pay | Admitting: Pediatrics

## 2021-07-01 ENCOUNTER — Ambulatory Visit (HOSPITAL_COMMUNITY): Payer: Medicaid Other | Admitting: Certified Registered Nurse Anesthetist

## 2021-07-01 ENCOUNTER — Encounter (HOSPITAL_COMMUNITY): Payer: Self-pay

## 2021-07-01 ENCOUNTER — Encounter (HOSPITAL_COMMUNITY)
Admission: RE | Disposition: A | Discharge status: Home / Self Care | Payer: Self-pay | Source: Home / Self Care | Attending: Pediatrics

## 2021-07-01 ENCOUNTER — Ambulatory Visit (HOSPITAL_COMMUNITY)
Admission: RE | Admit: 2021-07-01 | Discharge: 2021-07-01 | Disposition: A | Discharge status: Home / Self Care | Payer: Medicaid Other | Attending: Pediatrics | Admitting: Pediatrics

## 2021-07-01 DIAGNOSIS — Q851 Tuberous sclerosis: Secondary | ICD-10-CM | POA: Insufficient documentation

## 2021-07-01 DIAGNOSIS — D1771 Benign lipomatous neoplasm of kidney: Secondary | ICD-10-CM | POA: Diagnosis not present

## 2021-07-01 HISTORY — PX: RADIOLOGY WITH ANESTHESIA: SHX6223

## 2021-07-01 LAB — CBC WITH DIFFERENTIAL/PLATELET
Abs Immature Granulocytes: 0.03 10*3/uL (ref 0.00–0.07)
Basophils Absolute: 0 10*3/uL (ref 0.0–0.1)
Basophils Relative: 1 %
Eosinophils Absolute: 0.1 10*3/uL (ref 0.0–0.5)
Eosinophils Relative: 1 %
HCT: 29.5 % — ABNORMAL LOW (ref 39.0–52.0)
Hemoglobin: 9.7 g/dL — ABNORMAL LOW (ref 13.0–17.0)
Immature Granulocytes: 1 %
Lymphocytes Relative: 35 %
Lymphs Abs: 2.3 10*3/uL (ref 0.7–4.0)
MCH: 29.8 pg (ref 26.0–34.0)
MCHC: 32.9 g/dL (ref 30.0–36.0)
MCV: 90.5 fL (ref 80.0–100.0)
Monocytes Absolute: 0.5 10*3/uL (ref 0.1–1.0)
Monocytes Relative: 8 %
Neutro Abs: 3.7 10*3/uL (ref 1.7–7.7)
Neutrophils Relative %: 54 %
Platelets: 425 10*3/uL — ABNORMAL HIGH (ref 150–400)
RBC: 3.26 MIL/uL — ABNORMAL LOW (ref 4.22–5.81)
RDW: 12.3 % (ref 11.5–15.5)
WBC: 6.6 10*3/uL (ref 4.0–10.5)
nRBC: 0 % (ref 0.0–0.2)

## 2021-07-01 LAB — HEMOGLOBIN A1C
Hgb A1c MFr Bld: 5 % (ref 4.8–5.6)
Mean Plasma Glucose: 96.8 mg/dL

## 2021-07-01 LAB — LIPID PANEL
Cholesterol: 142 mg/dL (ref 0–200)
HDL: 43 mg/dL (ref >40)
LDL Cholesterol: 81 mg/dL (ref 0–99)
Total CHOL/HDL Ratio: 3.3 RATIO
Triglycerides: 90 mg/dL (ref <150)
VLDL: 18 mg/dL (ref 0–40)

## 2021-07-01 SURGERY — MRI WITH ANESTHESIA
Anesthesia: General

## 2021-07-01 MED ORDER — GADOBUTROL 1 MMOL/ML IV SOLN
6.5000 mL | Freq: Once | INTRAVENOUS | Status: AC | PRN
  Administered 2021-07-01: 6.5 mL via INTRAVENOUS

## 2021-07-01 MED ORDER — SUGAMMADEX SODIUM 200 MG/2ML IV SOLN
INTRAVENOUS | Status: DC | PRN
  Administered 2021-07-01: 300 mg via INTRAVENOUS

## 2021-07-01 MED ORDER — SUCCINYLCHOLINE CHLORIDE 200 MG/10ML IV SOSY
PREFILLED_SYRINGE | INTRAVENOUS | Status: DC | PRN
  Administered 2021-07-01: 100 mg via INTRAVENOUS

## 2021-07-01 MED ORDER — ROCURONIUM BROMIDE 10 MG/ML (PF) SYRINGE
PREFILLED_SYRINGE | INTRAVENOUS | Status: DC | PRN
  Administered 2021-07-01: 100 mg via INTRAVENOUS

## 2021-07-01 MED ORDER — PROPOFOL 10 MG/ML IV BOLUS
INTRAVENOUS | Status: DC | PRN
  Administered 2021-07-01 (×3): 50 mg via INTRAVENOUS
  Administered 2021-07-01: 100 mg via INTRAVENOUS

## 2021-07-01 MED ORDER — PROPOFOL 500 MG/50ML IV EMUL
INTRAVENOUS | Status: DC | PRN
  Administered 2021-07-01: 100 ug/kg/min via INTRAVENOUS

## 2021-07-01 MED ORDER — IOHEXOL 300 MG/ML  SOLN
100.0000 mL | Freq: Once | INTRAMUSCULAR | Status: AC | PRN
  Administered 2021-07-01: 100 mL via INTRAVENOUS

## 2021-07-01 MED ORDER — DEXMEDETOMIDINE (PRECEDEX) IN NS 20 MCG/5ML (4 MCG/ML) IV SYRINGE
PREFILLED_SYRINGE | INTRAVENOUS | Status: DC | PRN
  Administered 2021-07-01: 20 ug via INTRAVENOUS

## 2021-07-01 MED ORDER — LACTATED RINGERS IV SOLN: INTRAVENOUS | Status: DC

## 2021-07-01 MED ORDER — KETAMINE HCL 100 MG/ML IJ SOLN
INTRAMUSCULAR | Status: AC
  Filled 2021-07-01: qty 1

## 2021-07-01 MED ORDER — CHLORHEXIDINE GLUCONATE 0.12 % MT SOLN
OROMUCOSAL | Status: AC
  Filled 2021-07-01: qty 15

## 2021-07-01 MED ORDER — CHLORHEXIDINE GLUCONATE 0.12 % MT SOLN: 15.0000 mL | Freq: Once | OROMUCOSAL | Status: DC

## 2021-07-01 MED ORDER — ORAL CARE MOUTH RINSE: 15.0000 mL | Freq: Once | OROMUCOSAL | Status: DC

## 2021-07-01 MED ORDER — KETAMINE HCL 100 MG/ML IJ SOLN
INTRAMUSCULAR | Status: DC | PRN
  Administered 2021-07-01: 260 mg via ORAL

## 2021-07-01 NOTE — Anesthesia Procedure Notes (Signed)
Procedure Name: Intubation Date/Time: 07/01/2021 10:20 AM Performed by: Renato Shin, CRNA Pre-anesthesia Checklist: Patient identified, Emergency Drugs available, Suction available and Patient being monitored Patient Re-evaluated:Patient Re-evaluated prior to induction Oxygen Delivery Method: Circle system utilized Preoxygenation: Pre-oxygenation with 100% oxygen Induction Type: IV induction Ventilation: Mask ventilation without difficulty Laryngoscope Size: Miller and 3 Grade View: Grade I Tube type: Oral Tube size: 7.5 mm Number of attempts: 1 Airway Equipment and Method: Stylet and Oral airway Placement Confirmation: ETT inserted through vocal cords under direct vision, positive ETCO2 and breath sounds checked- equal and bilateral Secured at: 23 cm Tube secured with: Tape Dental Injury: Teeth and Oropharynx as per pre-operative assessment

## 2021-07-01 NOTE — Anesthesia Preprocedure Evaluation (Signed)
Anesthesia Evaluation  Patient identified by MRN, date of birth, ID band  Reviewed: Allergy & Precautions, NPO status , Patient's Chart, lab work & pertinent test results  History of Anesthesia Complications (+) PONV and history of anesthetic complications  Airway Mallampati: I  TM Distance: >3 FB Neck ROM: Full    Dental  (+) Teeth Intact, Dental Advisory Given   Pulmonary    breath sounds clear to auscultation       Cardiovascular negative cardio ROS   Rhythm:Regular Rate:Normal     Neuro/Psych Seizures -,  PSYCHIATRIC DISORDERS - Autistic   GI/Hepatic negative GI ROS, Neg liver ROS,   Endo/Other  negative endocrine ROS  Renal/GU negative Renal ROS     Musculoskeletal negative musculoskeletal ROS (+)   Abdominal Normal abdominal exam  (+)   Peds  Hematology negative hematology ROS (+)   Anesthesia Other Findings   Reproductive/Obstetrics                             Anesthesia Physical Anesthesia Plan  ASA: 3  Anesthesia Plan: General   Post-op Pain Management:    Induction: Intravenous  PONV Risk Score and Plan: 3 and Ondansetron, Dexamethasone and Midazolam  Airway Management Planned: Oral ETT  Additional Equipment: None  Intra-op Plan:   Post-operative Plan: Extubation in OR  Informed Consent: I have reviewed the patients History and Physical, chart, labs and discussed the procedure including the risks, benefits and alternatives for the proposed anesthesia with the patient or authorized representative who has indicated his/her understanding and acceptance.     Dental advisory given and Consent reviewed with POA  Plan Discussed with: CRNA  Anesthesia Plan Comments: (Discussed plan with mom. She states he does not tolerate PO midazolam. IM Ketamine has been successful in the past per the patient. Due to patient's physical stature and unpredictable physical activity (not  willing to stay in bed) in the preop area, we will proceed with PO Ketamine as a sedative prior to IV placement. Mom states patient is willing to drink soda so Ketamine will be placed in the soda for sedation (5mg /kg). )        Anesthesia Quick Evaluation

## 2021-07-01 NOTE — Anesthesia Postprocedure Evaluation (Signed)
Anesthesia Post Note  Patient: [facility]  Procedure(s) Performed: MRI WITH ANESTHESIA-BRAIN WITH AND WITHOUT CT WITH ANESTHESIA-ABDOMEN AND PELVIS WITH CONTRAST     Patient location during evaluation: PACU Anesthesia Type: General Level of consciousness: awake and alert Pain management: pain level controlled Vital Signs Assessment: post-procedure vital signs reviewed and stable Respiratory status: spontaneous breathing, nonlabored ventilation, respiratory function stable and patient connected to nasal cannula oxygen Cardiovascular status: blood pressure returned to baseline and stable Postop Assessment: no apparent nausea or vomiting Anesthetic complications: no   No notable events documented.  Last Vitals:  Vitals:   07/01/21 1209 07/01/21 1219  BP: 109/67 121/67  Pulse: (!) 101 84  Resp: (!) 23 17  Temp:  (!) 36.1 C  SpO2: 98% 100%    Last Pain:  Vitals:   07/01/21 1219  TempSrc:   PainSc: 0-No pain                 Effie Berkshire

## 2021-07-01 NOTE — Progress Notes (Signed)
Ventilator patient transported from MRI to CT to PACU without any complications.

## 2021-07-01 NOTE — Transfer of Care (Signed)
Immediate Anesthesia Transfer of Care Note  Patient: [facility]  Procedure(s) Performed: MRI WITH ANESTHESIA-BRAIN WITH AND WITHOUT CT WITH ANESTHESIA-ABDOMEN AND PELVIS WITH CONTRAST  Patient Location: PACU  Anesthesia Type:General  Level of Consciousness: drowsy and patient cooperative  Airway & Oxygen Therapy: Patient Spontanous Breathing and Patient connected to face mask oxygen  Post-op Assessment: Report given to RN and Post -op Vital signs reviewed and stable  Post vital signs: Reviewed and stable  Last Vitals:  Vitals Value Taken Time  BP 112/75 07/01/21 1149  Temp    Pulse 98 07/01/21 1200  Resp 21 07/01/21 1200  SpO2 100 % 07/01/21 1200  Vitals shown include unvalidated device data.  Last Pain:  Vitals:   07/01/21 1134  TempSrc:   PainSc: Asleep         Complications: No notable events documented.

## 2021-07-02 ENCOUNTER — Encounter (HOSPITAL_COMMUNITY): Payer: Self-pay | Admitting: Radiology

## 2021-07-02 LAB — LAMOTRIGINE LEVEL: Lamotrigine Lvl: 24.1 ug/mL — ABNORMAL HIGH (ref 2.0–20.0)

## 2021-07-02 LAB — PROLACTIN: Prolactin: 163 ng/mL — ABNORMAL HIGH (ref 4.0–15.2)

## 2021-07-07 ENCOUNTER — Telehealth (INDEPENDENT_AMBULATORY_CARE_PROVIDER_SITE_OTHER): Payer: Self-pay | Admitting: Pediatrics

## 2021-07-07 NOTE — Telephone Encounter (Signed)
12-minute discussion with mother.  The sedation went terribly.  Rather than getting IM ketamine the anesthesiologist decided on p.o. ketamine which had a paradoxical effect and ultimately 6 security guards had to hold him down so that an IV could be placed to give him medication.  It appears that this was totally avoidable if the anesthesiologist have listened to the parents.  Imaging studies were high-quality.  Brain is largely unchanged.  CT shows that there is some increase in the size of angiomyolipomas but it small.  I told mother that she ought to get up with the renal physicians and interventional radiologist at [facility] to see if they felt anything needed to be done.  Chest x-ray was normal.  Prolactin level likely indicates the effect of Risperdal on the patient.  He does not have a mass in the pituitary.  Lamotrigine level is high but was the morning the level.  CBC was normal.  All this information is available on my chart for the [facility] physicians.  I told mother I would help in any way that I could.

## 2021-07-21 ENCOUNTER — Telehealth (INDEPENDENT_AMBULATORY_CARE_PROVIDER_SITE_OTHER): Payer: Self-pay | Admitting: Pediatrics

## 2021-07-21 NOTE — Telephone Encounter (Signed)
  Who's calling (name and relationship to patient) : Dr. Michelle Nasuti, child psychologist  Best contact number: [phone]  Provider they see: Dr. Gaynell Face  Reason for call: Dr. Michelle Nasuti would like to discuss this mutual patient with Dr. Gaynell Face.    PRESCRIPTION REFILL ONLY  Name of prescription:  Pharmacy:

## 2021-07-21 NOTE — Telephone Encounter (Signed)
I spoke with Dr. Michelle Nasuti and later with [facility].  I do not have an explanation for the lowered hemoglobin.  MCV is normal white blood cell count is normal platelet count is normal.  He has had no bleeding.  Though he has angiomyolipomas of the kidney I do not think that should be a reason for his lowered hemoglobin.  The biggest problem is that we can easily do blood tests on him.  Mother is aware of this and agrees with it.  For now we are just going to have to watch.  If he needs to have some form of embolization surgery for his angiomyolipomata, that may be our opportunity to recheck his blood.  I talked to mom about pallor and decreased level of activity.  She says that he seems perfectly fine.  I also talked about the elevated prolactin with Pogash.  I feel very certain that is related to the very high dose of risperidone that he is given.  I have looked at his imaging studies and he does not have a pituitary mass.

## 2021-07-21 NOTE — Telephone Encounter (Signed)
I returned the phone call.  I assume this is about the elevated prolactin probably caused by risperidone.  The patient does not have evidence of a pituitary tumor on his MRI scan.  I told Dr. Michelle Nasuti that I would be happy to take his call and gave him my cell phone.

## 2021-08-06 IMAGING — MR MR HEAD WO/W CM
4 of 13 series · 13 of 48 positions shown · IV contrast (Yes GAD)
Comparison: 07/13/2017

CLINICAL DATA: Tuberous sclerosis follow-up.

EXAM:
MRI HEAD WITHOUT AND WITH CONTRAST
TECHNIQUE: Multiplanar, multiecho pulse sequences of the brain and surrounding
structures were obtained without and with intravenous contrast.
CONTRAST:  6.5mL GADAVIST GADOBUTROL 1 MMOL/ML IV SOLN

[Series 2: DWI · axial · 3.0mm · 0.94mm/px · z∈[-16,+104]mm · 6 of 100 slices shown]
[im 1/100]
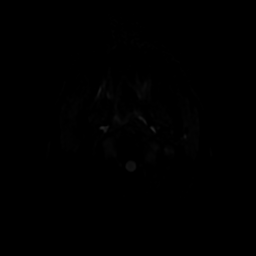
[im 17/100]
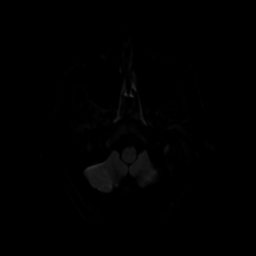
[im 34/100]
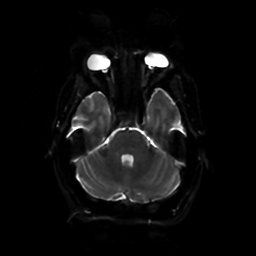
[im 50/100]
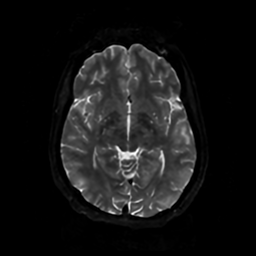
[im 67/100]
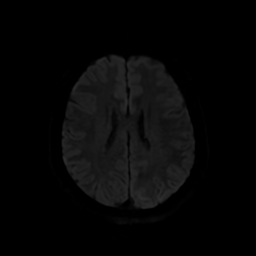
[im 83/100]
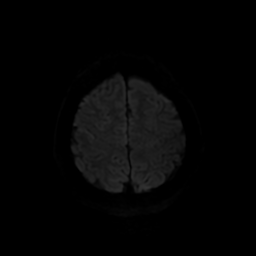

[Series 4: FLAIR · sagittal · 5.0mm · 0.23mm/px · 2 of 23 slices shown (1 of 2)]
[im 1/23]
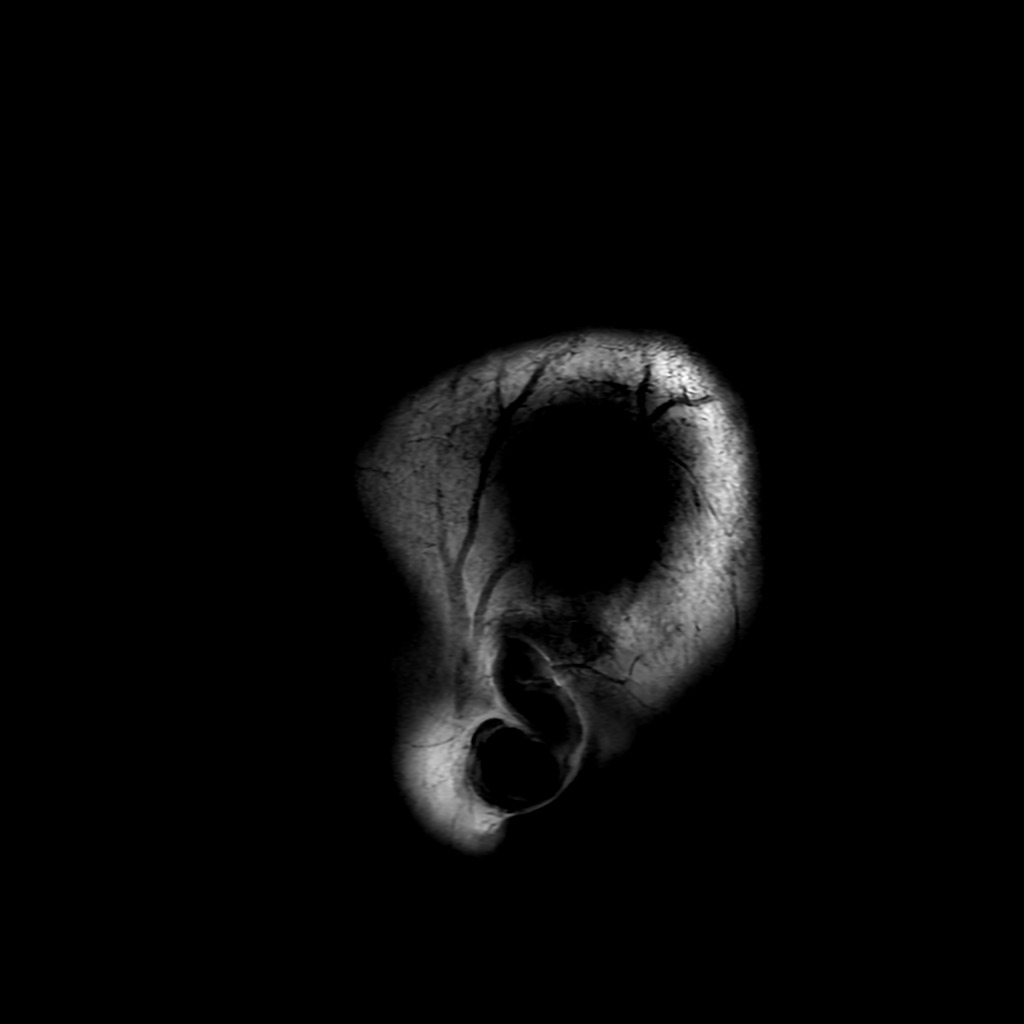
[im 23/23]
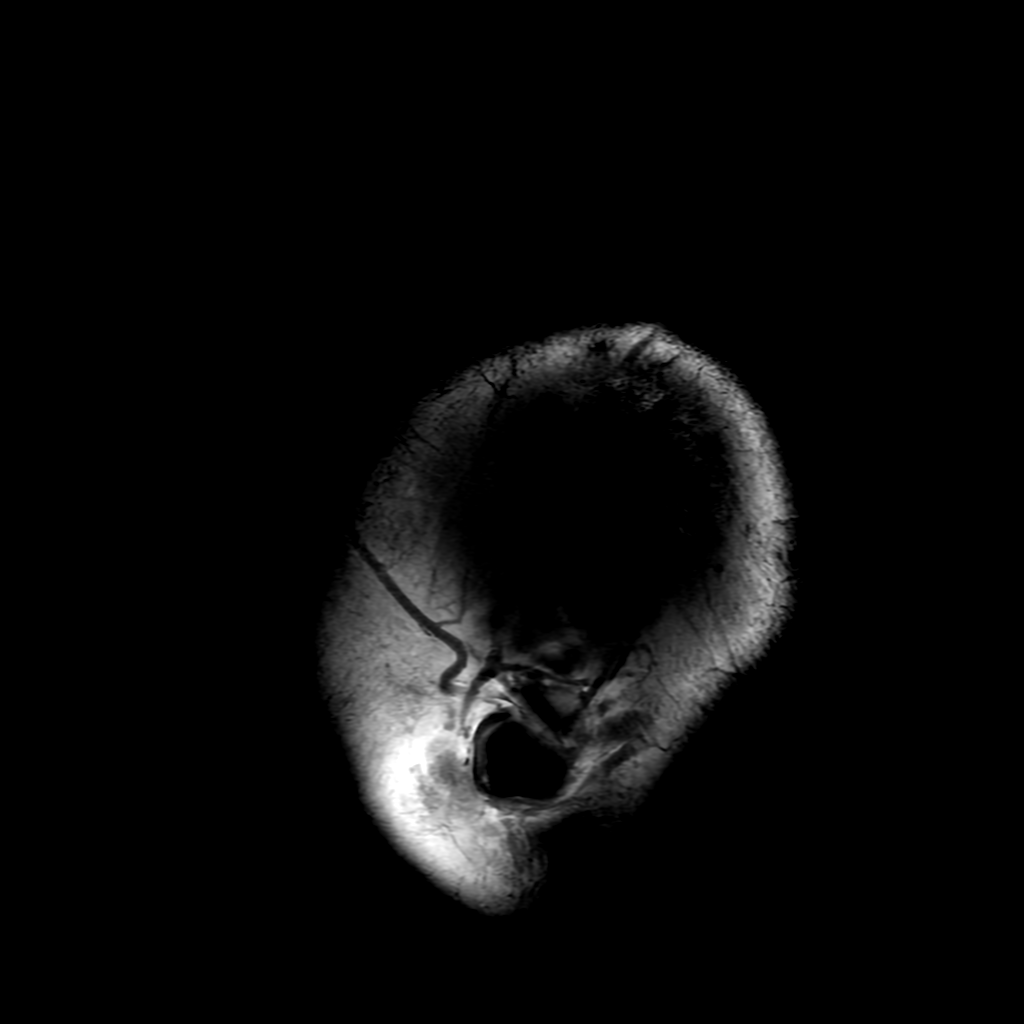

[Series 6: FLAIR · axial · 4.0mm · 0.45mm/px · z∈[-13,+129]mm · 3 of 34 slices shown (2 of 2)]
[im 1/34]
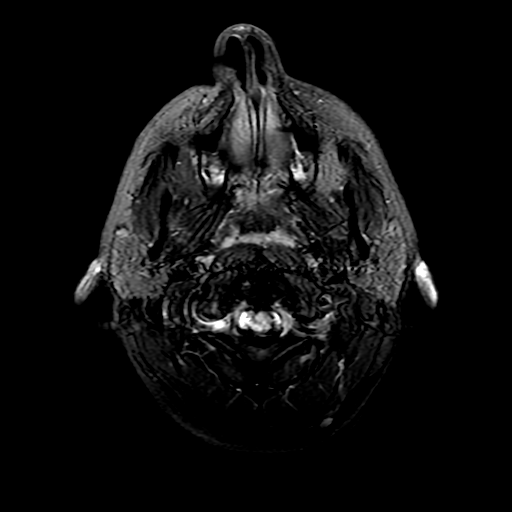
[im 17/34]
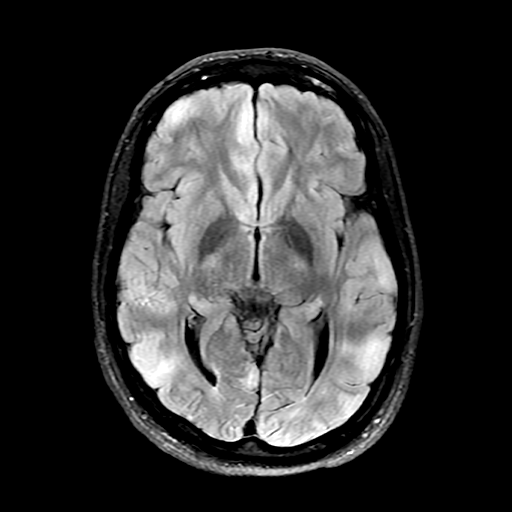
[im 34/34]
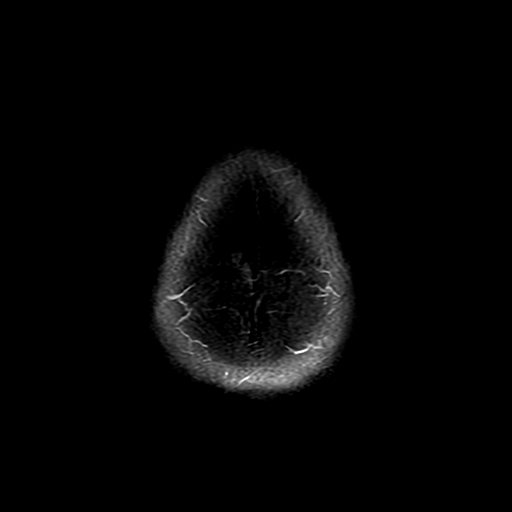

[Series 12: FLAIR post-contrast · sagittal · 5.0mm · 0.23mm/px · 2 of 23 slices shown]
[im 1/23]
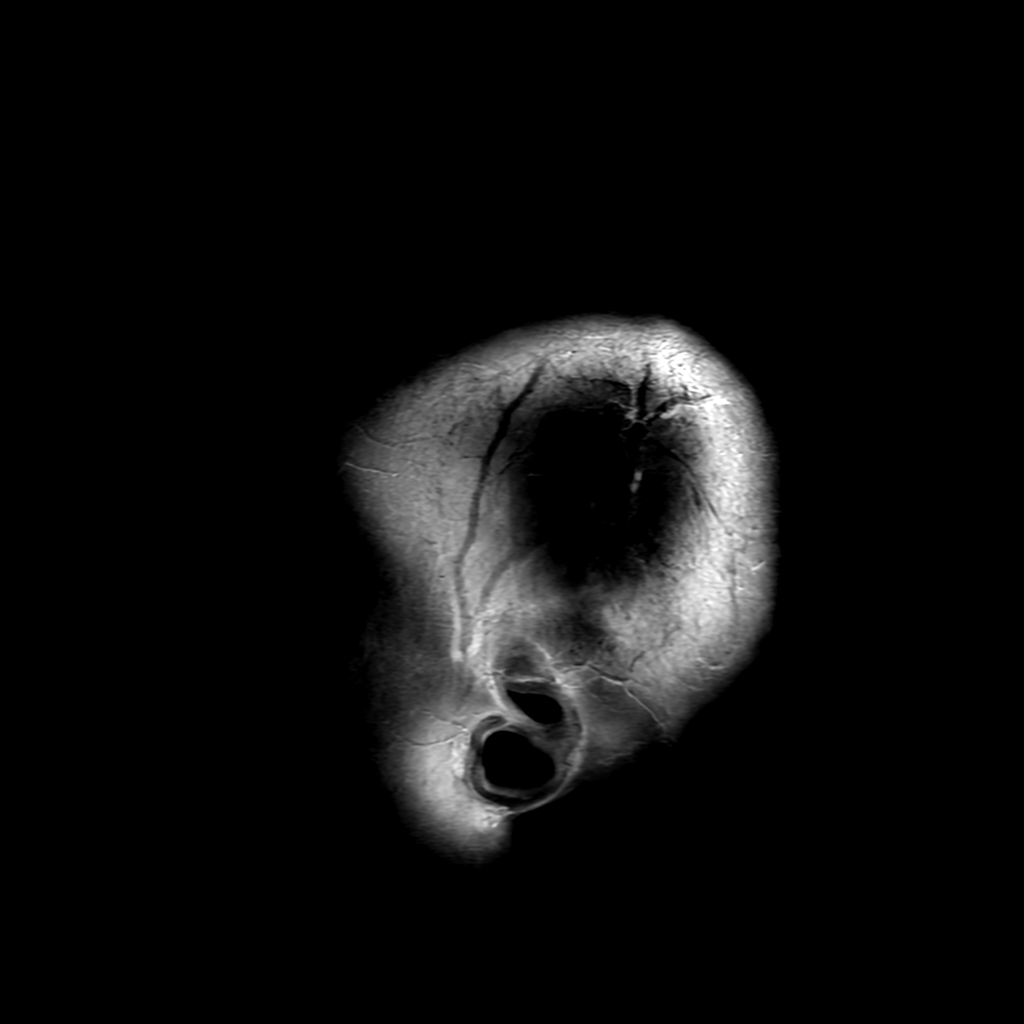
[im 23/23]
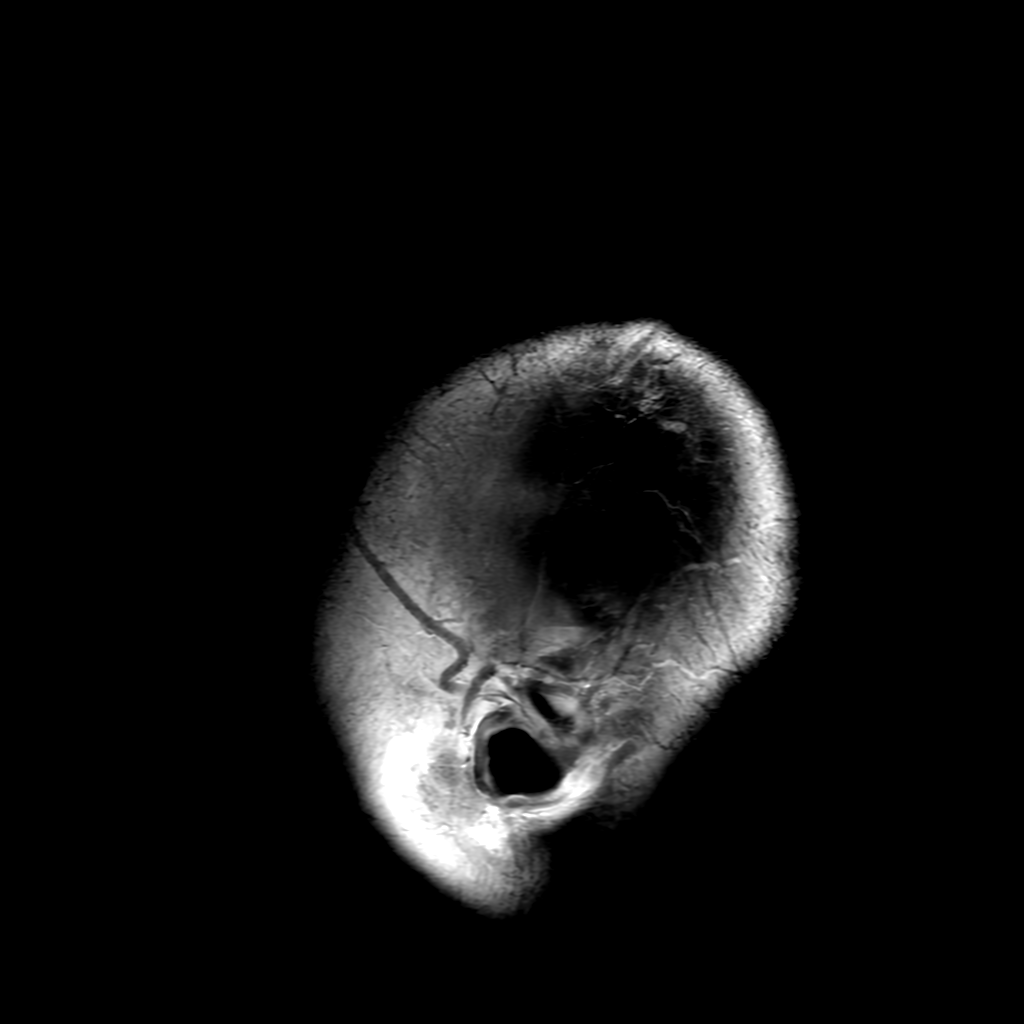

[13 of 48 positions shown; findings below may reference images not displayed]

FINDINGS: Brain: Extensive cortical and subcortical tubers. Multiple calcified
and noncalcified sub appended mole nodules greatest at the right
caudothalamic groove where a 10 mm measurement likely includes 2
nodules. The enhancement pattern of these nodules is unchanged. No
incidental infarct, hydrocephalus, or collection.

Vascular: Preserved flow voids and vascular enhancements.

Skull and upper cervical spine: Normal marrow signal

Sinuses/Orbits: Negative
IMPRESSION: Widespread findings of tuberous sclerosis. No change from 2157 to
imply astrocytoma.

## 2021-08-09 ENCOUNTER — Encounter (INDEPENDENT_AMBULATORY_CARE_PROVIDER_SITE_OTHER): Payer: Self-pay

## 2021-08-10 ENCOUNTER — Telehealth (INDEPENDENT_AMBULATORY_CARE_PROVIDER_SITE_OTHER): Payer: Self-pay | Admitting: Pediatrics

## 2021-08-10 NOTE — Telephone Encounter (Signed)
I made a phone call to the family concerning the FMLA which we have filled out and faxed.  I had to leave a message.

## 2021-08-11 NOTE — Telephone Encounter (Signed)
I called mom and I think that we have straightened out the FMLA we will resend it.

## 2021-08-19 ENCOUNTER — Telehealth (INDEPENDENT_AMBULATORY_CARE_PROVIDER_SITE_OTHER): Payer: Self-pay | Admitting: Pediatrics

## 2021-08-19 NOTE — Telephone Encounter (Signed)
We found the FMLA form I amended it and we sent it back.  I informed the family.

## 2021-10-07 NOTE — Progress Notes (Signed)
Patient: Cameron Riley MRN: [id number] Sex: male DOB: Sep 21, 2001  Provider: Carylon Perches, MD Location of Care: [facility]  Note type: New patient  History of Present Illness: Referral Source: Dr. Wyline Copas, MD History from: patient and prior records Chief Complaint: Tuberous Sclerosis/epilepsy/autism  Cameron Riley is a 20 y.o. male with history of clinical diagnosis of Tuberous Sclerosis, focal epilepsy, and autism who was previously seen by Dr. Gaynell Face, and is now being seen by me for follow-up. Review of prior history shows patient was last seen by Dr. Gaynell Face on 04/12/21 where all of his medications were continued.  Patient presents today with parents.  They report:     Symptom Management:  His last seizure was several years ago Presented as: eyes rolled back and freezing, they would last about a min or two. He is also not aggressive with others anymore and will not break things, however, they do note he will try to hit himself when frustrated. For the most part he sleeps well.  Care Coordination (other providers): He does see [facility] psychiatry 10/08/21 who was concerned for anemia. They were worried about his elevated prolactin however, believe it is likely related related to Risperdal. They have him on on the klonopin for tics.   Mom reports he has not seen an opthalmologist or a dermatologist.   Care management: Mom has guardianship.  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 Recently got a new case worker through [employer] He is not interested in any other programs.   Patient History:  Behavior History Autism spectrum disorder, level 3, hyperactive, agitatation  Medication History Clonidine caused him to be more agitated  Diagnostics:  Repeat MRI and CT of abdomen scan were done in  07/01/2021.  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 Most are slightly increased in size since 2018. The largest in the right lower pole measures slightly smaller, 4.2 cm compared to 5.4 cm previously.    Past Medical History Past Medical History:  Diagnosis Date   Autism    Complication of anesthesia    PONV (postoperative nausea and vomiting)    Seizures ([place])    last one 2021 (as of 06/30/21)   TS (tuberous sclerosis) Cameron Riley)     Surgical History Past Surgical History:  Procedure Laterality Date   KIDNEY SURGERY  06/2014   Performed at [facility]   MRI     history multiple MRIs   RADIOLOGY WITH ANESTHESIA N/A 05/08/2014   Procedure: MRI;  Surgeon: Medication Radiologist, MD;  Location: [place];  Service: Radiology;  Laterality: N/A;   RADIOLOGY WITH ANESTHESIA N/A 07/13/2017   Procedure: MRI BRAIN WITH AND WITHOUT CONTRAST, CT OF RENAL ABDOMIN WITH AND WITHOUT;  Surgeon: Radiologist, Medication, MD;  Location: [place];  Service: Radiology;  Laterality: N/A;   RADIOLOGY WITH ANESTHESIA N/A 07/01/2021   Procedure: MRI WITH ANESTHESIA-BRAIN WITH AND WITHOUT;  Surgeon: Radiologist, Medication, MD;  Location: [place];  Service: Radiology;  Laterality: N/A;   RADIOLOGY WITH ANESTHESIA N/A 07/01/2021   Procedure: CT WITH ANESTHESIA-ABDOMEN AND PELVIS WITH CONTRAST;  Surgeon: Radiologist, Medication, MD;  Location: [place];  Service: Radiology;  Laterality: N/A;    Family History family history includes Heart disease in  his paternal grandfather.   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   He attends [employer].     He lives with both parents and he has one brother who is 13 yo.    He enjoys swinging and being outside.     Allergies No Known Allergies  Medications Current  Outpatient Medications on File Prior to Visit  Medication Sig Dispense Refill   clonazePAM (KLONOPIN) 1 MG tablet Take 0.5-1 mg by mouth See admin instructions. Take 0.5 mg in the morning and lunch and 1 mg at bedtime     risperiDONE (RISPERDAL) 2 MG tablet Take 3-4 mg by mouth See admin instructions. Take 3 mg in the morning  and 4 mg a bedtime     desmopressin (DDAVP) 0.2 MG tablet Take 200 mcg by mouth at bedtime. (Patient not taking: Reported on 10/14/2021)     Current Facility-Administered Medications on File Prior to Visit  Medication Dose Route Frequency Provider Last Rate Last Admin   lactated ringers infusion    Continuous PRN Carney Living, CRNA   New Bag at 05/08/14 0815   The medication list was reviewed and reconciled. All changes or newly prescribed medications were explained.  A complete medication list was provided to the patient/caregiver.  Physical Exam BP 116/80   Pulse 76   Wt 148 lb 12.8 oz (67.5 kg)   BMI 20.75 kg/m  Facility age limit for growth percentiles is 20 years.  No results found. Gen: well appearing young man Skin: No rash, No neurocutaneous stigmata. HEENT: Normocephalic, no dysmorphic features, no conjunctival injection, nares patent, mucous membranes moist, oropharynx clear. Neck: Supple, no meningismus. No focal tenderness. Resp: Clear to auscultation bilaterally CV: Regular rate, normal S1/S2, no murmurs, no rubs Abd: BS present, abdomen soft, non-tender, non-distended. No hepatosplenomegaly or mass Ext: Warm and well-perfused. No deformities, no muscle wasting, ROM full.  Neurological Examination: MS: Awake, alert, interactive. Poor eye contact, answers pointed questions .  Cranial Nerves: Pupils were equal and reactive to light;  EOM normal, no nystagmus; no ptsosis, no double vision, intact facial sensation, face symmetric with full strength of facial muscles, hearing intact grossly.  Motor-Normal tone throughout, Normal strength in all  muscle groups. No abnormal movements Reflexes- Reflexes 2+ and symmetric in the biceps, triceps, patellar and achilles tendon. Plantar responses flexor bilaterally, no clonus noted Sensation: Intact to light touch throughout.   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 male with history of clinical diagnosis of Tuberous Sclerosis, focal epilepsy, and autism who was previously seen by Dr. Gaynell Face that presents for continuation of care though me today. I am glad to hear that his seizures are well controlled with his Lamictal, and I continued that today. I also obtained a history for Jayshon related to his Tuberous Sclerosis as I will be managing his care. To do this I requested the family complete the TAND form so I may get a better understanding of how this medical condition is affecting Brayn. I agree that his elevated prolactin is likely related to Risperdal, given there are no pituitary tumors shown in  the most recent MRI, and I am not concerned at this time. I also provided information on iron supplements that may help to address his anemia. I informed parents about further treatments being performed in  tuberous sclerosis clinics if they ever are interested in more treatment of this condition.    Refilled Lamictal  Recommended Vitron C for anemia concerns Provided TAND papers today  Provided information on the tuberous sclerosis clinics and offered information on further treatments for family if they are interested.  I spent 35  minutes on day of service on this patient including review of chart, discussion with patient and family, discussion of screening results, coordination with other providers and management of  orders and paperwork.   Return in about 6 months (around 04/14/2022).  I, Scharlene Gloss, scribed for and in the presence of Carylon Perches, MD at today's visit on 10/14/2021.  I, Carylon Perches MD MPH, personally performed the services described in this documentation, as scribed by Scharlene Gloss in my presence on 10/14/21 and it is accurate, complete, and reviewed by me.   Carylon Perches MD MPH Neurology and [facility]  [place], [place], [place] [address] Phone: [phone]

## 2021-10-13 ENCOUNTER — Ambulatory Visit (INDEPENDENT_AMBULATORY_CARE_PROVIDER_SITE_OTHER): Payer: Medicaid Other | Admitting: Pediatrics

## 2021-10-14 ENCOUNTER — Encounter (INDEPENDENT_AMBULATORY_CARE_PROVIDER_SITE_OTHER): Payer: Self-pay | Admitting: Pediatrics

## 2021-10-14 ENCOUNTER — Other Ambulatory Visit: Payer: Self-pay

## 2021-10-14 ENCOUNTER — Ambulatory Visit (INDEPENDENT_AMBULATORY_CARE_PROVIDER_SITE_OTHER): Payer: Medicaid Other | Admitting: Pediatrics

## 2021-10-14 VITALS — BP 116/80 | HR 76 | Wt 148.8 lb

## 2021-10-14 DIAGNOSIS — G40209 Localization-related (focal) (partial) symptomatic epilepsy and epileptic syndromes with complex partial seizures, not intractable, without status epilepticus: Secondary | ICD-10-CM

## 2021-10-14 DIAGNOSIS — Q851 Tuberous sclerosis: Secondary | ICD-10-CM

## 2021-10-14 DIAGNOSIS — F840 Autistic disorder: Secondary | ICD-10-CM

## 2021-10-14 MED ORDER — LAMICTAL 200 MG PO TABS: ORAL_TABLET | ORAL | 5 refills | Status: DC

## 2021-10-14 NOTE — Patient Instructions (Addendum)
Refilled Lamictal today  Recommend Vitron C as an iron supplement Provided TAND papers today, when you have time you can full them out     [facility] we are committed to providing exceptional care. You will receive a patient satisfaction survey through text or email regarding your visit today. Your opinion is important to me. Comments are appreciated.

## 2021-11-15 ENCOUNTER — Encounter (INDEPENDENT_AMBULATORY_CARE_PROVIDER_SITE_OTHER): Payer: Self-pay | Admitting: Pediatrics

## 2022-04-06 NOTE — Progress Notes (Signed)
Patient: Cameron Riley MRN: [id number] Sex: male DOB: 2001-10-08  Provider: Carylon Perches, MD Location of Care: [facility]  Note type: Routine follow-up  History of Present Illness:  Cameron Riley is a 21 y.o. male with history of clinical diagnosis of Tuberous Sclerosis, focal epilepsy, and autism who I am seeing for routine follow-up. Patient was last seen on 10/14/21 where I refilled Lamictal and provided TAND form for the family to complete.  Since the last appointment, patient has started seeing psychiatry who is managing his Zoloft, mom reported in a visit on 3/31 he has been taking Desmopressin infrequently, which was noted to lower seizure threshold as well as an increased prolactin level which may be related to his medications.   Patient presents today with both parents.  They report no seizures since last appointment. They report mood improved with change in management with psychiatrist.  He sometimes hits himself, but is not aggressive to others. Enjoys himself as long as you leave him alone. No perceived headaches, vision changes,or any other concerns regarding TS.   [employer] workers are bringing him out into the community and it is going well.   Screenings:  Patient History (from previous records):  His last seizure around 2020 [facility]: eyes rolled back and freezing, they would last about a min or two.  Mom reports he has not seen an opthalmologist or a dermatologist recently.     Care management: Mom has guardianship.  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 Recently got a new case worker through [employer] He is not interested in any other programs.   Behavior History Autism spectrum disorder, level 3, hyperactive, agitatation   Medication History Clonidine caused him to be more agitated    Diagnostics:  Repeat MRI and CT of abdomen scan were done in 07/01/2021.   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 Most are slightly increased in size since 2018. The largest in the right lower pole measures slightly smaller, 4.2 cm compared to 5.4 cm previously.   Past Medical History Past Medical History:  Diagnosis Date   Autism    Complication of anesthesia    PONV (postoperative nausea and vomiting)    Seizures ([place])    last one 2021 (as of 06/30/21)   TS (tuberous sclerosis) [facility])     Surgical History Past Surgical History:  Procedure Laterality Date   KIDNEY SURGERY  06/2014   Performed at [facility]   MRI     history multiple MRIs   RADIOLOGY WITH ANESTHESIA N/A 05/08/2014   Procedure: MRI;  Surgeon: Medication Radiologist, MD;  Location: [place];  Service: Radiology;  Laterality: N/A;   RADIOLOGY WITH ANESTHESIA N/A 07/13/2017   Procedure: MRI BRAIN WITH AND WITHOUT CONTRAST, CT OF RENAL ABDOMIN WITH AND WITHOUT;  Surgeon: Radiologist, Medication, MD;  Location: [place];  Service: Radiology;  Laterality: N/A;   RADIOLOGY WITH ANESTHESIA N/A 07/01/2021   Procedure: MRI WITH ANESTHESIA-BRAIN WITH AND WITHOUT;  Surgeon: Radiologist, Medication, MD;  Location: [place];  Service: Radiology;  Laterality: N/A;   RADIOLOGY WITH ANESTHESIA N/A 07/01/2021   Procedure: CT WITH ANESTHESIA-ABDOMEN AND PELVIS WITH CONTRAST;  Surgeon: Radiologist, Medication, MD;  Location: [place];  Service:  Radiology;  Laterality: N/A;    Family History family history includes Heart disease in his paternal grandfather.   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     He is not in a day program.    He lives with both parents and he has one brother who is 70 yo.    He  enjoys swinging and being outside.     Allergies No Known Allergies  Medications Current Outpatient Medications on File Prior to Visit  Medication Sig Dispense Refill   clonazePAM (KLONOPIN) 1 MG tablet Take 0.5-1 mg by mouth See admin instructions. Take 0.5 mg in the morning and lunch and 1 mg at bedtime     Multiple Vitamins-Iron (MULTIVITAMINS WITH IRON) TABS tablet Take 1 tablet by mouth daily.     risperiDONE (RISPERDAL) 2 MG tablet Take 3-4 mg by mouth See admin instructions. Take 3 mg in the morning  and 4 mg a bedtime     sertraline (ZOLOFT) 25 MG tablet Take 37.5 mg by mouth.     desmopressin (DDAVP) 0.2 MG tablet Take 200 mcg by mouth at bedtime. (Patient not taking: Reported on 10/14/2021)     Current Facility-Administered Medications on File Prior to Visit  Medication Dose Route Frequency Provider Last Rate Last Admin   lactated ringers infusion    Continuous PRN Carney Living, CRNA   New Bag at 05/08/14 0815   The medication list was reviewed and reconciled. All changes or newly prescribed medications were explained.  A complete medication list was provided to the patient/caregiver.  Physical Exam BP 110/62   Ht '5\' 11"'$  (1.803 m)   Wt 150 lb (68 kg)   BMI 20.92 kg/m  Facility age limit for growth percentiles is 20 years.  No results found. Gen: well appearing young man Skin: No rash, No neurocutaneous stigmata. HEENT: Normocephalic, no dysmorphic features, no conjunctival injection, nares patent, mucous membranes moist, oropharynx clear. Neck: Supple, no meningismus. No focal tenderness. Resp: Clear to auscultation bilaterally CV: Regular rate, normal S1/S2, no murmurs, no rubs Abd: BS present, abdomen soft, non-tender, non-distended. No hepatosplenomegaly or mass Ext: Warm and well-perfused. No deformities, no muscle wasting, ROM full.   Neurological Examination: MS: Awake, alert, interactive. Poor eye contact, answers pointed questions .  Cranial Nerves:  Pupils were equal and reactive to light;  EOM normal, no nystagmus; no ptsosis, no double vision, intact facial sensation, face symmetric with full strength of facial muscles, hearing intact grossly.  Motor-Normal tone throughout, Normal strength in all muscle groups. No abnormal movements Reflexes- Reflexes 2+ and symmetric in the biceps, triceps, patellar and achilles tendon. Plantar responses flexor bilaterally, no clonus noted Sensation: Intact to light touch throughout.   Coordination: No dysmetria with reaching for objects Gait: Normal gait   Diagnosis: 1. Partial epilepsy with impairment of consciousness ([place])      Assessment and Plan Cameron Riley is a 21 y.o. male with history of clinical diagnosis of Tuberous Sclerosis, focal epilepsy, and autism who I am seeing in follow-up. Seizures remain well controlled on Lamictal.  Discussed abortive medication for seizures today in case he does have any break through seizures, family declined.  Parents did not remember receiving TAND checklist for behavioral symptoms in TS, so provided that again.    Lamictal refilled at current dose Recommend repeat MRI in about 2 years TAND checklist provided today, symptoms  to look at for TS Parents to call for any concerns  Return in about 1 year (around 04/15/2023).  Ellie Canty prepared the note but was not present during the visit or for completion of this documentation.   Carylon Perches MD MPH Neurology and [facility]  [place], [place], [place] [address] Phone: [phone] Fax: [phone]

## 2022-04-14 ENCOUNTER — Ambulatory Visit (INDEPENDENT_AMBULATORY_CARE_PROVIDER_SITE_OTHER): Payer: Medicaid Other | Admitting: Pediatrics

## 2022-04-14 ENCOUNTER — Encounter (INDEPENDENT_AMBULATORY_CARE_PROVIDER_SITE_OTHER): Payer: Self-pay | Admitting: Pediatrics

## 2022-04-14 VITALS — BP 110/62 | Ht 71.0 in | Wt 150.0 lb

## 2022-04-14 DIAGNOSIS — F840 Autistic disorder: Secondary | ICD-10-CM | POA: Diagnosis not present

## 2022-04-14 DIAGNOSIS — G40209 Localization-related (focal) (partial) symptomatic epilepsy and epileptic syndromes with complex partial seizures, not intractable, without status epilepticus: Secondary | ICD-10-CM

## 2022-04-14 DIAGNOSIS — Q851 Tuberous sclerosis: Secondary | ICD-10-CM

## 2022-04-14 MED ORDER — LAMICTAL 200 MG PO TABS: ORAL_TABLET | ORAL | 12 refills | Status: DC

## 2022-04-14 NOTE — Patient Instructions (Addendum)
Continue Lamictal ?Repeat MRI in about 2 years ?TAND checklist provided today, symptoms to look at for TS ?Call for any concerns ? ? ?

## 2022-05-09 ENCOUNTER — Encounter (INDEPENDENT_AMBULATORY_CARE_PROVIDER_SITE_OTHER): Payer: Self-pay | Admitting: Pediatrics

## 2022-12-20 ENCOUNTER — Encounter (INDEPENDENT_AMBULATORY_CARE_PROVIDER_SITE_OTHER): Payer: Self-pay | Admitting: Pediatrics

## 2023-04-24 ENCOUNTER — Ambulatory Visit (INDEPENDENT_AMBULATORY_CARE_PROVIDER_SITE_OTHER): Payer: Medicaid Other | Admitting: Pediatrics

## 2023-05-11 NOTE — Progress Notes (Signed)
Patient: Cameron Riley MRN: [id number] Sex: male DOB: 2001/05/05  Provider: Lorenz Coaster, MD Location of Care: [facility]  Note type: Routine follow-up  History of Present Illness:  Cameron Riley is a 22 y.o. male with history of clinical diagnosis of Tuberous Sclerosis, focal epilepsy, and autism who I am seeing for routine follow-up. Patient was last seen on 04/14/22 where I refilled Lamictal.  Since the last appointment, he has continued to follow up with Dr. Clent Ridges for psychiatry at [facility].   Patient presents today with his parents they report no seizures since the last visit.   They started Abilify and planning to wean Risperdal related to increased prolactin, with psychiatry. Have increased sertraline to 75 mg daily.   Reviewed imaging hx. Due for CT and MRI in the next year. Parents are interested in getting this done over the 1st of next year.   Patient History:  His last seizure around 2020. semiology: eyes rolled back and freezing, they would last about a min or two.   Mom reports he has not seen an opthalmologist or a dermatologist recently.     Care management: Mom has guardianship.  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 Recently got a new case worker through [employer] He is not interested in any other programs.    Behavior History Autism spectrum disorder, level 3, hyperactive, agitatation   Medication History Clonidine caused him to be more agitated   Diagnostics:  Repeat MRI and CT of abdomen scan were done in 07/01/2021.   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 Most are slightly increased in size since 2018. The largest in the right  lower pole measures slightly smaller, 4.2 cm compared to 5.4 cm previously.   Past Medical History Past Medical History:  Diagnosis Date   Autism    Complication of anesthesia    PONV (postoperative nausea and vomiting)    Seizures (HCC)    last one 2021 (as of 06/30/21)   TS (tuberous sclerosis) [facility])     Surgical History Past Surgical History:  Procedure Laterality Date   KIDNEY SURGERY  06/2014   Performed at [facility]   MRI     history multiple MRIs   RADIOLOGY WITH ANESTHESIA N/A 05/08/2014   Procedure: MRI;  Surgeon: Medication Radiologist, MD;  [facility]: [facility] OR;  Service: Radiology;  Laterality: N/A;   RADIOLOGY WITH ANESTHESIA N/A 07/13/2017   Procedure: MRI BRAIN WITH AND WITHOUT CONTRAST, CT OF RENAL ABDOMIN WITH AND WITHOUT;  Surgeon: Radiologist, Medication, MD;  Location: [facility] OR;  Service: Radiology;  Laterality: N/A;   RADIOLOGY WITH ANESTHESIA N/A 07/01/2021   Procedure: MRI WITH ANESTHESIA-BRAIN WITH AND WITHOUT;  Surgeon: Radiologist, Medication, MD;  Location: [facility] OR;  Service: Radiology;  Laterality: N/A;   RADIOLOGY WITH ANESTHESIA N/A 07/01/2021   Procedure: CT WITH ANESTHESIA-ABDOMEN AND PELVIS WITH CONTRAST;  Surgeon: Radiologist, Medication, MD;  Location: [facility] OR;  Service: Radiology;  Laterality: N/A;    Family History family history includes Heart disease in his paternal grandfather.   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   He attends [employer].     He  is not in a day program.    He lives with both parents and he has one brother who is 65 yo.    He enjoys swinging and being outside.     Allergies No Known Allergies  Medications Current Outpatient Medications on File Prior to Visit  Medication Sig Dispense Refill   ARIPiprazole (ABILIFY) 2 MG tablet Take 2 mg by mouth at bedtime.     clonazePAM (KLONOPIN) 1 MG tablet Take 0.5-1 mg by mouth See admin instructions. Take 0.5 mg in the  morning and lunch and 1 mg at bedtime     LAMICTAL 200 MG tablet TAKE 2 AND 1/2 TABLETS BY MOUTH EACH MORNING AND EACH NIGHT AT BEDTIME 155 tablet 12   risperiDONE (RISPERDAL) 2 MG tablet Take 3-4 mg by mouth See admin instructions. Take 3 mg in the morning  and 4 mg a bedtime     sertraline (ZOLOFT) 50 MG tablet Take 75 mg by mouth daily.     desmopressin (DDAVP) 0.2 MG tablet Take 200 mcg by mouth at bedtime. (Patient not taking: Reported on 10/14/2021)     Multiple Vitamins-Iron (MULTIVITAMINS WITH IRON) TABS tablet Take 1 tablet by mouth daily. (Patient not taking: Reported on 05/18/2023)     Current Facility-Administered Medications on File Prior to Visit  Medication Dose Route Frequency Provider Last Rate Last Admin   lactated ringers infusion    Continuous PRN Shirlyn Goltz Edyth Gunnels, CRNA   New Bag at 05/08/14 0815   The medication list was reviewed and reconciled. All changes or newly prescribed medications were explained.  A complete medication list was provided to the patient/caregiver.  Physical Exam Ht 6' 1.62" (1.87 m)   Wt 148 lb (67.1 kg)   BMI 19.20 kg/m  Facility age limit for growth %iles is 20 years.  No results found. Gen: well appearing  young man Skin: Red growth on lateral left eye. HEENT: Normocephalic, no dysmorphic features, no conjunctival injection, nares patent, mucous membranes moist, oropharynx clear. Neck: Supple, no meningismus. No focal tenderness. Resp: Clear to auscultation bilaterally CV: Regular rate, normal S1/S2, no murmurs, no rubs Abd: BS present, abdomen soft, non-tender, non-distended. No hepatosplenomegaly or mass Ext: Warm and well-perfused. No deformities, no muscle wasting, ROM full.  Neurological Examination: MS: Awake, alert, interactive. Answers pointed questions with 1 word answers, speech was fluent.  Cranial Nerves: Pupils were equal and reactive to light;  EOM normal, no nystagmus; no ptsosis, intact facial sensation, face symmetric with  full strength of facial muscles, hearing intact grossly.  Motor-Normal tone throughout, Normal strength in all muscle groups. No abnormal movements Reflexes- Reflexes 2+ and symmetric in the biceps, triceps, patellar and achilles tendon. Plantar responses flexor bilaterally, no clonus noted Sensation: Intact to light touch throughout.   Coordination: No dysmetria with reaching for objects Gait: normal gait    Diagnosis: 1. Tuberous sclerosis (HCC)   2. Partial epilepsy with impairment of consciousness (HCC)   3. Autism spectrum disorder associated with known medical or genetic condition or environmental factor, requiring very substantial support (level 3)      Assessment and Plan ACEA HOILAND is a 22 y.o. male with history of clinical diagnosis of Tuberous Sclerosis, focal epilepsy, and autism who I am seeing in follow-up. No seizures since last visit, recommend continuing on current AED regimen. 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 Reviewed guidelines for treatment of Tuberous sclerosis. Recommend r repeat  MRI, CT abdomen, and EKG within the next year, discussed with family and plan to repeat after Jan 1st.   - Refilled Lamictal  - Ordered sirolimus (Hyftor) for spot for face - Plan for repeat MRI, CT abdomen, EKG and labs within 1 year.   I spent 30 minutes on day of service on this patient including review of chart, review of TS guidelines, discussion with patient and family, coordination with other providers and management of orders and paperwork.     Return in about 7 months (around 12/18/2023).  I, Mayra Reel, scribed for and in the presence of Lorenz Coaster, MD at today's visit on 05/18/2023.   ILorenz Coaster MD MPH, personally performed the services described in this documentation, as scribed by Mayra Reel in my presence on 05/18/2023 and it is accurate, complete, and reviewed by me.     Lorenz Coaster MD MPH Neurology and Neurodevelopment [facility]  [address] [place], [place], [place] [address] Phone: [phone] Fax: [phone]

## 2023-05-18 ENCOUNTER — Ambulatory Visit (INDEPENDENT_AMBULATORY_CARE_PROVIDER_SITE_OTHER): Payer: Medicaid Other | Admitting: Pediatrics

## 2023-05-18 ENCOUNTER — Encounter (INDEPENDENT_AMBULATORY_CARE_PROVIDER_SITE_OTHER): Payer: Self-pay | Admitting: Pediatrics

## 2023-05-18 VITALS — Ht 73.62 in | Wt 148.0 lb

## 2023-05-18 DIAGNOSIS — G40209 Localization-related (focal) (partial) symptomatic epilepsy and epileptic syndromes with complex partial seizures, not intractable, without status epilepticus: Secondary | ICD-10-CM | POA: Diagnosis not present

## 2023-05-18 DIAGNOSIS — F840 Autistic disorder: Secondary | ICD-10-CM | POA: Diagnosis not present

## 2023-05-18 DIAGNOSIS — Q851 Tuberous sclerosis: Secondary | ICD-10-CM | POA: Diagnosis not present

## 2023-05-18 MED ORDER — SIROLIMUS 0.2 % EX GEL: CUTANEOUS | 3 refills | Status: DC

## 2023-05-18 NOTE — Patient Instructions (Addendum)
Continue his Lamictal.  Lets try the cream sirolimus (Hyftor) for the spot on near his eye. Apply once daily.  At the next visit we will order routine imaging with sedation.

## 2023-05-19 ENCOUNTER — Telehealth (INDEPENDENT_AMBULATORY_CARE_PROVIDER_SITE_OTHER): Payer: Self-pay | Admitting: Pediatrics

## 2023-05-19 NOTE — Telephone Encounter (Signed)
Mom called back and stated that the pharmacy couldn't find the medication that was prescribed when they looked it up but, they do have  another medication that treats the same symptoms. Mom stated that she is okay with the other medication if Dr. Artis Flock approves.   Per Dr. Artis Flock, the substituted medication is approved.   Contacted the pharmacy who took the verbal change over the phone.  SS, CCMA

## 2023-05-19 NOTE — Telephone Encounter (Signed)
  Name of who is calling: Brittney  Caller's Relationship to Patient: [facility]  Best contact number: [phone]  Provider they see: [facility]  Reason for call: Pharmacist called and left a voicemail on yesterday 05/18/23 stating she received a prescription refill request for Sirolimus. She stated that it's out of stock but have a solution that she can put in place. She also states that prescription could be sent to a different pharmacy. She's requesting a callback.      PRESCRIPTION REFILL ONLY  Name of prescription: Sirolimus  Pharmacy: [facility]

## 2023-05-19 NOTE — Telephone Encounter (Signed)
Contacted patient's mom and informed her of the previous message. I asked mom if there was another pharmacy she would like this RX to be sent to.   Mom stated that she works at the hospital where [employer] is, she will ask the pharmacist what the difference is from the solution and the Gel?  Mom stated that she will call back to see if the solution will be okay.   SS, CCMA

## 2023-05-22 ENCOUNTER — Other Ambulatory Visit (INDEPENDENT_AMBULATORY_CARE_PROVIDER_SITE_OTHER): Payer: Self-pay | Admitting: Pediatrics

## 2023-05-22 ENCOUNTER — Telehealth (INDEPENDENT_AMBULATORY_CARE_PROVIDER_SITE_OTHER): Payer: Self-pay

## 2023-05-22 ENCOUNTER — Encounter (INDEPENDENT_AMBULATORY_CARE_PROVIDER_SITE_OTHER): Payer: Self-pay | Admitting: Pediatrics

## 2023-05-22 ENCOUNTER — Telehealth (INDEPENDENT_AMBULATORY_CARE_PROVIDER_SITE_OTHER): Payer: Self-pay | Admitting: Pediatrics

## 2023-05-22 DIAGNOSIS — G40209 Localization-related (focal) (partial) symptomatic epilepsy and epileptic syndromes with complex partial seizures, not intractable, without status epilepticus: Secondary | ICD-10-CM

## 2023-05-22 NOTE — Telephone Encounter (Signed)
Received a fax from pharmacy requesting a PA for patients Lamictal.   PA processed and approved for brand name medication.   Effective 6.3.2024 - 5.29.2025  PA #: [id number]  SS, CCMA

## 2023-05-22 NOTE — Telephone Encounter (Signed)
  Name of who is calling: Shana cotterman  Caller's Relationship to Patient:  Best contact number:[phone]  Provider they see: Lorenz Coaster  Reason for call: Clarify a cream that was prescribed     PRESCRIPTION REFILL ONLY  Name of prescription:  Pharmacy:

## 2023-05-22 NOTE — Telephone Encounter (Signed)
Usually, I need a specific specialty pharmacy to send to.  I am cc'ing this to our pharmacist to see if they can help.    Lorenz Coaster MD MPH

## 2023-05-22 NOTE — Telephone Encounter (Signed)
Contacted patients pharmacy who informed that they are unable to order the alternative for this medication. Tech stated that it would need to be sent to a specialty pharmacy.'  SS, CCMA

## 2023-12-18 ENCOUNTER — Ambulatory Visit (INDEPENDENT_AMBULATORY_CARE_PROVIDER_SITE_OTHER): Payer: Medicaid Other | Admitting: Pediatrics

## 2024-01-03 NOTE — Progress Notes (Incomplete)
Patient: Cameron Riley MRN: [id number] Sex: male DOB: May 08, 2001  Provider: Lorenz Coaster, MD Location of Care: [facility]  Note type: Routine follow-up  History of Present Illness:  Cameron Riley is a 23 y.o. male with history of clinical diagnosis of Tuberous Sclerosis, focal epilepsy, and autism who I am seeing for routine follow-up. Patient was last seen on 05/18/2023 where I refilled Lamictal, ordered sirolimus, and planned for repeat MRI, CT abdomen, EKG and labs within 1 year.  Since the last appointment, discussed dermatology referral in order to get sirolimus and has continued to follow with [facility] who increased abilify, decreased risperidone, and continued other medications.   Patient presents today with {CHL AMB PARENT/GUARDIAN:[id number]} who reports the following:     No seizures, still taking medicaiton well   Increasing abilify, tryng to get off risperdal.  RIght now not sleeping.  Sits in his bed and tlaks all night long.  As they are coming off the risperdal, he is not interested in eating as much.    Have never tried benedryl. Melatonin didn't help.     Screenings:  Patient History:  His last seizure around 2020. semiology: eyes rolled back and freezing, they would last about a min or two.   Mom reports he has not seen an opthalmologist or a dermatologist recently.     Care management: Mom has guardianship.  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 Recently got a new case worker through [employer]. He is not interested in any other programs.   Didn't have any problem with the tailored plan.  Still getting CAP   Behavior History Autism spectrum disorder, level 3, hyperactive, agitatation   Medication History Clonidine caused him to be more agitated   Diagnostics:  Repeat MRI and  CT of abdomen scan were done in 07/01/2021.   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 Most are slightly increased in size since 2018. The largest in the right lower pole measures slightly smaller, 4.2 cm compared to 5.4 cm previously.     Past Medical History Past Medical History:  Diagnosis Date   Autism    Complication of anesthesia    PONV (postoperative nausea and vomiting)    Seizures (HCC)    last one 2021 (as of 06/30/21)   TS (tuberous sclerosis) [facility])     Surgical History Past Surgical History:  Procedure Laterality Date   KIDNEY SURGERY  06/2014   Performed at [facility]   MRI     history multiple MRIs   RADIOLOGY WITH ANESTHESIA N/A 05/08/2014   Procedure: MRI;  Surgeon: Medication Radiologist, MD;  Location: [facility] OR;  Service: Radiology;  Laterality: N/A;   RADIOLOGY WITH ANESTHESIA N/A 07/13/2017   Procedure: MRI BRAIN WITH AND WITHOUT CONTRAST, CT OF RENAL ABDOMIN WITH AND WITHOUT;  Surgeon: Radiologist, Medication, MD;  Location: [facility] OR;  Service: Radiology;  Laterality: N/A;   RADIOLOGY WITH ANESTHESIA N/A 07/01/2021   Procedure: MRI WITH ANESTHESIA-BRAIN WITH AND WITHOUT;  Surgeon: Radiologist, Medication, MD;  Location: [facility] OR;  Service: Radiology;  Laterality: N/A;   RADIOLOGY WITH ANESTHESIA N/A 07/01/2021   Procedure: CT WITH ANESTHESIA-ABDOMEN AND PELVIS WITH CONTRAST;  Surgeon: Radiologist, Medication, MD;  Location: [facility]  OR;  Service: Radiology;  Laterality: N/A;    Family History family history includes Heart disease in his paternal grandfather.   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   He attends [employer].     He is not in a day program.    He lives with both parents and he has one brother who is 74 yo.    He enjoys swinging and being  outside.     Allergies No Known Allergies  Medications Current Outpatient Medications on File Prior to Visit  Medication Sig Dispense Refill   ARIPiprazole (ABILIFY) 5 MG tablet Take 1 tablet by mouth at bedtime.     clonazePAM (KLONOPIN) 1 MG tablet Take 0.5-1 mg by mouth See admin instructions. Take 0.5 mg in the morning and lunch and 1 mg at bedtime     LAMICTAL 200 MG tablet TAKE 2 AND 1/2 TABLETS BY MOUTH EACH MORNING AND EACH NIGHT AT BEDTIME 155 tablet 12   Multiple Vitamins-Iron (MULTIVITAMINS WITH IRON) TABS tablet Take 1 tablet by mouth daily.     risperiDONE (RISPERDAL) 2 MG tablet Take 3-4 mg by mouth See admin instructions. Take 3 mg in the morning  and 4 mg a bedtime     sertraline (ZOLOFT) 50 MG tablet Take 75 mg by mouth daily.     ARIPiprazole (ABILIFY) 2 MG tablet Take 2 mg by mouth at bedtime.     desmopressin (DDAVP) 0.2 MG tablet Take 200 mcg by mouth at bedtime. (Patient not taking: Reported on 01/08/2024)     Sirolimus 0.2 % GEL Apply small amount to affected areas on the face nightly (Patient not taking: Reported on 01/08/2024) 10 g 3   Current Facility-Administered Medications on File Prior to Visit  Medication Dose Route Frequency Provider Last Rate Last Admin   lactated ringers infusion    Continuous PRN Rogelia Boga, CRNA   New Bag at 05/08/14 0815   The medication list was reviewed and reconciled. All changes or newly prescribed medications were explained.  A complete medication list was provided to the patient/caregiver.  Physical Exam BP 130/84 (BP Location: Left Arm, Patient Position: Sitting, Cuff Size: Normal)   Pulse 100   Ht 5' 11.02" (1.804 m)   Wt 149 lb 9.6 oz (67.9 kg)   BMI 20.85 kg/m  Facility age limit for growth %iles is 20 years.  No results found.  ***   Diagnosis:No diagnosis found.   Assessment and Plan Cameron Riley is a 23 y.o. male with history of clinical diagnosis of Tuberous Sclerosis, focal epilepsy, and autism who I  am seeing in follow-up.   I spent 25 minutes on day of service on this patient including review of chart, discussion with patient and family, discussion of screening results, coordination with other providers and management of orders and paperwork.     No follow-ups on file.  Lorenz Coaster MD MPH Neurology and Neurodevelopment [facility]  [address] [place], [place], [place] [address] Phone: [phone] Fax: [phone]

## 2024-01-08 ENCOUNTER — Ambulatory Visit (INDEPENDENT_AMBULATORY_CARE_PROVIDER_SITE_OTHER): Payer: MEDICAID | Admitting: Pediatrics

## 2024-01-08 ENCOUNTER — Encounter (INDEPENDENT_AMBULATORY_CARE_PROVIDER_SITE_OTHER): Payer: Self-pay | Admitting: Pediatrics

## 2024-01-08 VITALS — BP 130/84 | HR 100 | Ht 71.02 in | Wt 149.6 lb

## 2024-01-08 DIAGNOSIS — Z79899 Other long term (current) drug therapy: Secondary | ICD-10-CM

## 2024-01-08 DIAGNOSIS — F840 Autistic disorder: Secondary | ICD-10-CM | POA: Diagnosis not present

## 2024-01-08 DIAGNOSIS — Q851 Tuberous sclerosis: Secondary | ICD-10-CM | POA: Diagnosis not present

## 2024-01-08 DIAGNOSIS — G40209 Localization-related (focal) (partial) symptomatic epilepsy and epileptic syndromes with complex partial seizures, not intractable, without status epilepticus: Secondary | ICD-10-CM | POA: Diagnosis not present

## 2024-01-08 MED ORDER — LAMICTAL 200 MG PO TABS: ORAL_TABLET | ORAL | 12 refills | Status: DC

## 2024-01-08 MED ORDER — SIROLIMUS 0.2 % EX GEL: CUTANEOUS | 3 refills | Status: DC

## 2024-01-08 NOTE — Patient Instructions (Signed)
Give Benadryl 50 mg at night for sleep. You can try it during the day to see what the effect is for [facility].  Continued Lamictal Ordered sirolimus  Ordered MRI of brain, abdomen, and pelvis, EKG and labs.

## 2024-01-10 ENCOUNTER — Other Ambulatory Visit (HOSPITAL_COMMUNITY): Payer: Medicaid Other

## 2024-01-28 ENCOUNTER — Encounter (INDEPENDENT_AMBULATORY_CARE_PROVIDER_SITE_OTHER): Payer: Self-pay | Admitting: Pediatrics

## 2024-05-20 ENCOUNTER — Telehealth (INDEPENDENT_AMBULATORY_CARE_PROVIDER_SITE_OTHER): Payer: Self-pay | Admitting: Pediatrics

## 2024-05-20 NOTE — Telephone Encounter (Signed)
  Name of who is calling: [facility] de[facility]   Caller's Relationship to Patient:   Best contact number: [phone] ask for sheryl or [facility]   Provider they see: [facility]   Reason for call: calling to see if we received a hmp form stating that it is safe for pt to have sedation. She says this form was sent over within the past last couple of weeks. Pt has appointment with them on 6/12, she states if we did not receive it she can just refax it. They would like a call back to confirm.      PRESCRIPTION REFILL ONLY  Name of prescription:  Pharmacy:

## 2024-05-28 ENCOUNTER — Other Ambulatory Visit: Payer: Self-pay

## 2024-05-28 ENCOUNTER — Encounter (HOSPITAL_COMMUNITY): Payer: Self-pay | Admitting: *Deleted

## 2024-05-28 NOTE — Anesthesia Preprocedure Evaluation (Addendum)
 Anesthesia Evaluation  Patient identified by MRN, date of birth, ID band Patient awake    Reviewed: Allergy & Precautions, NPO status , Patient's Chart, lab work & pertinent test results  History of Anesthesia Complications (+) PONV and history of anesthetic complications  Airway Mallampati: Unable to assess  TM Distance: >3 FB Neck ROM: Full    Dental  (+) Dental Advisory Given   Pulmonary neg pulmonary ROS   Pulmonary exam normal        Cardiovascular negative cardio ROS Normal cardiovascular exam     Neuro/Psych Seizures -, Well Controlled,  PSYCHIATRIC DISORDERS       Autism    GI/Hepatic negative GI ROS, Neg liver ROS,,,  Endo/Other  negative endocrine ROS    Renal/GU negative Renal ROS     Musculoskeletal negative musculoskeletal ROS (+)    Abdominal   Peds  Hematology negative hematology ROS (+)   Anesthesia Other Findings Tuberous sclerosis   Reproductive/Obstetrics                             Anesthesia Physical Anesthesia Plan  ASA: 3  Anesthesia Plan: General   Post-op Pain Management: Minimal or no pain anticipated   Induction:   PONV Risk Score and Plan: 3 and Treatment may vary due to age or medical condition, Ondansetron  and Midazolam   Airway Management Planned: Oral ETT  Additional Equipment: None  Intra-op Plan:   Post-operative Plan: Extubation in OR  Informed Consent: I have reviewed the patients History and Physical, chart, labs and discussed the procedure including the risks, benefits and alternatives for the proposed anesthesia with the patient or authorized representative who has indicated his/her understanding and acceptance.     Dental advisory given and Consent reviewed with POA  Plan Discussed with: CRNA and Anesthesiologist  Anesthesia Plan Comments: (Plan for IM Ketamine  induction in operating room followed by IV placement, intubation,  EKG, and lab draws prior to sending patient downstairs for MRI  PAT note by Rudy Costain, PA-C: 23 year old male follows with pediatric neurology for history of tuberous sclerosis, epilepsy, and autism.  Last seen by Dr. Francesco Inks on 01/08/2024.  Stable at that time.  Per note, Patient stable, no seizures in several years. Patient with large angioma at eyelid. I attempted to order sirolimus  at last appointment, and didn't go through. Will try this again and if it doesn't work, will refer to dermatology. Patient due for imaging, EKG and bloodwork. Will require sedation for these given behaviors.  Per last anesthesia plan comments on 07/01/2021, Discussed plan with mom. She states he does not tolerate PO midazolam . IM Ketamine  has been successful in the past per the patient. Due to patient's physical stature and unpredictable physical activity (not willing to stay in bed) in the preop area, we will proceed with PO Ketamine  as a sedative prior to IV placement. Mom states patient is willing to drink soda so Ketamine  will be placed in the soda for sedation (5mg /kg).  Per report of patient's mother, sedation did not go well.  This was documented in subsequent notes 07/07/2021 by pediatric neurologist Dr. Darlys Eland stating, 12-minute discussion with mother. The sedation went terribly. Rather than getting IM ketamine  the anesthesiologist decided on p.o. ketamine  which had a paradoxical effect and ultimately 6 security guards had to hold him down so that an IV could be placed to give him medication.  Review of prior anesthesia encounters indicates that he has previously responded  well to IM ketamine .   Patient will need day of surgery evaluation.)        Anesthesia Quick Evaluation

## 2024-05-28 NOTE — Progress Notes (Signed)
 Melissa Xayasine was notified of needing H&P for this MRI procedure scheduled on 05/30/24.  PCP - Dr Lujean Sake Cardiologist - n/a Neurology - Dr Marny Sires Psychiatry - Dr Judithe Novel  Chest x-ray - 07/01/21 EKG - n/a Stress Test - n/a ECHO - n/a Cardiac Cath - n/a  ICD Pacemaker/Loop - n/a  Sleep Study -  n/a CPAP - none  Diabetes - n/a  ASA & Blood Thinner Instructions:  n/a  NPO  Anesthesia review: Yes. Royston Cornea, [place]  STOP now taking any Aspirin (unless otherwise instructed by your surgeon), Aleve, Naproxen, Ibuprofen, Motrin, Advil, Goody's, BC's, all herbal medications, fish oil, and all vitamins.   Coronavirus Screening Does the patient have any of the following symptoms:  Cough yes/no: No Fever (>100.59F)  yes/no: No Runny nose yes/no: No Sore throat yes/no: No Difficulty breathing/shortness of breath  yes/no: No  Has the patient traveled in the last 14 days and where? yes/no: No  Patient's mother Harshal Sirmon verbalized understanding of instructions that were given via phone.

## 2024-05-28 NOTE — Progress Notes (Incomplete Revision)
 Anesthesia Chart Review: Same day workup  23 year old male follows with pediatric neurology for history of tuberous sclerosis, epilepsy, and autism.  Last seen by Dr. Francesco Inks on 01/08/2024.  Stable at that time.  Per note, "Patient stable, no seizures in several years. Patient with large angioma at eyelid. I attempted to order sirolimus  at last appointment, and didn't go through. Will try this again and if it doesn't work, will refer to dermatology. Patient due for imaging, EKG and bloodwork. Will require sedation for these given behaviors."  Per last anesthesia plan comments on 07/01/2021, "Discussed plan with mom. She states he does not tolerate PO midazolam . IM Ketamine  has been successful in the past per the patient. Due to patient's physical stature and unpredictable physical activity (not willing to stay in bed) in the preop area, we will proceed with PO Ketamine  as a sedative prior to IV placement. Mom states patient is willing to drink soda so Ketamine  will be placed in the soda for sedation (5mg /kg)."  Per report of patient's mother, sedation did not go well.  This was documented in subsequent notes 07/07/2021 by pediatric neurologist Dr. Darlys Eland stating, "12-minute discussion with mother. The sedation went terribly. Rather than getting IM ketamine  the anesthesiologist decided on p.o. ketamine  which had a paradoxical effect and ultimately 6 security guards had to hold him down so that an IV could be placed to give him medication."  Patient will need day of surgery evaluation.    Edilia Gordon [facility] Short Stay Center/Anesthesiology Phone [phone] 05/28/2024 10:25 AM

## 2024-05-28 NOTE — Progress Notes (Signed)
 Anesthesia Chart Review: Same day workup  23 year old male follows with pediatric neurology for history of tuberous sclerosis, epilepsy, and autism.  Last seen by Dr. Francesco Inks on 01/08/2024.  Stable at that time.  Per note, Patient stable, no seizures in several years. Patient with large angioma at eyelid. I attempted to order sirolimus  at last appointment, and didn't go through. Will try this again and if it doesn't work, will refer to dermatology. Patient due for imaging, EKG and bloodwork. Will require sedation for these given behaviors.  Per last anesthesia plan comments on 07/01/2021, Discussed plan with mom. She states he does not tolerate PO midazolam . IM Ketamine  has been successful in the past per the patient. Due to patient's physical stature and unpredictable physical activity (not willing to stay in bed) in the preop area, we will proceed with PO Ketamine  as a sedative prior to IV placement. Mom states patient is willing to drink soda so Ketamine  will be placed in the soda for sedation (5mg /kg).  Per report of patient's mother, sedation did not go well.  This was documented in subsequent notes 07/07/2021 by pediatric neurologist Dr. Darlys Eland stating, 12-minute discussion with mother. The sedation went terribly. Rather than getting IM ketamine  the anesthesiologist decided on p.o. ketamine  which had a paradoxical effect and ultimately 6 security guards had to hold him down so that an IV could be placed to give him medication.  Review of prior anesthesia encounters indicates that he has previously responded well to IM ketamine .   Patient will need day of surgery evaluation.    Edilia Gordon [facility] Short Stay Center/Anesthesiology Phone [phone] 05/28/2024 10:25 AM

## 2024-05-29 NOTE — Telephone Encounter (Signed)
 Scheduling for imaging department called, she states that she talked to [facility] last week about forms that are needed for sedation. She says that it still says pending, and wonders if those forms are being worked on. She says patient has appointment tomorrow but if they don't have them back by then they will have to rs his appointment. She says if she doesn't hear anything in about a hour she will call pt to reschedule, she will be there until 5pm [phone] ask for [facility].

## 2024-05-29 NOTE — Telephone Encounter (Addendum)
 Received approval from patients insurance.  Called Melissa with this information.  Also documented within the patients chart.    SS, CCMA

## 2024-05-29 NOTE — Telephone Encounter (Signed)
 Contacted Ms. Melissa to inform her of the forms being signed.  Patient Care Coordinator is on her way back with he signed forms.  Ms. Lovett Ruck also asked about the Status of PA. Informed her that the PA was submitted yesterday, I will call and check the status and call her back.  SS, CCMA

## 2024-05-30 ENCOUNTER — Ambulatory Visit (HOSPITAL_COMMUNITY)
Admission: RE | Admit: 2024-05-30 | Discharge: 2024-05-30 | Disposition: A | Discharge status: Home / Self Care | Payer: MEDICAID | Attending: Pediatrics | Admitting: Pediatrics

## 2024-05-30 ENCOUNTER — Ambulatory Visit (HOSPITAL_COMMUNITY)
Admission: RE | Admit: 2024-05-30 | Discharge: 2024-05-30 | Disposition: A | Discharge status: Home / Self Care | Payer: MEDICAID | Source: Ambulatory Visit | Attending: Pediatrics | Admitting: Pediatrics

## 2024-05-30 ENCOUNTER — Ambulatory Visit (HOSPITAL_BASED_OUTPATIENT_CLINIC_OR_DEPARTMENT_OTHER): Payer: MEDICAID | Admitting: Physician Assistant

## 2024-05-30 ENCOUNTER — Ambulatory Visit (HOSPITAL_COMMUNITY): Payer: MEDICAID | Admitting: Physician Assistant

## 2024-05-30 ENCOUNTER — Encounter (HOSPITAL_COMMUNITY)
Admission: RE | Disposition: A | Discharge status: Home / Self Care | Payer: Self-pay | Source: Home / Self Care | Attending: Pediatrics

## 2024-05-30 DIAGNOSIS — Q851 Tuberous sclerosis: Secondary | ICD-10-CM

## 2024-05-30 DIAGNOSIS — G40109 Localization-related (focal) (partial) symptomatic epilepsy and epileptic syndromes with simple partial seizures, not intractable, without status epilepticus: Secondary | ICD-10-CM | POA: Diagnosis not present

## 2024-05-30 DIAGNOSIS — F840 Autistic disorder: Secondary | ICD-10-CM | POA: Diagnosis not present

## 2024-05-30 DIAGNOSIS — I4510 Unspecified right bundle-branch block: Secondary | ICD-10-CM | POA: Insufficient documentation

## 2024-05-30 HISTORY — PX: RADIOLOGY WITH ANESTHESIA: SHX6223

## 2024-05-30 LAB — COMPREHENSIVE METABOLIC PANEL WITH GFR
ALT: 21 U/L (ref 0–44)
AST: 28 U/L (ref 15–41)
Albumin: 4.5 g/dL (ref 3.5–5.0)
Alkaline Phosphatase: 81 U/L (ref 38–126)
Anion gap: 14 (ref 5–15)
BUN: 7 mg/dL (ref 6–20)
CO2: 21 mmol/L — ABNORMAL LOW (ref 22–32)
Calcium: 9.6 mg/dL (ref 8.9–10.3)
Chloride: 104 mmol/L (ref 98–111)
Creatinine, Ser: 0.93 mg/dL (ref 0.61–1.24)
GFR, Estimated: >60 mL/min (ref >60)
Glucose, Bld: 107 mg/dL — ABNORMAL HIGH (ref 70–99)
Potassium: 3.7 mmol/L (ref 3.5–5.1)
Sodium: 139 mmol/L (ref 135–145)
Total Bilirubin: 0.3 mg/dL (ref 0.0–1.2)
Total Protein: 7.6 g/dL (ref 6.5–8.1)

## 2024-05-30 LAB — CBC
HCT: 39.9 % (ref 39.0–52.0)
Hemoglobin: 13.2 g/dL (ref 13.0–17.0)
MCH: 29.7 pg (ref 26.0–34.0)
MCHC: 33.1 g/dL (ref 30.0–36.0)
MCV: 89.9 fL (ref 80.0–100.0)
Platelets: 347 10*3/uL (ref 150–400)
RBC: 4.44 MIL/uL (ref 4.22–5.81)
RDW: 12.1 % (ref 11.5–15.5)
WBC: 7.9 10*3/uL (ref 4.0–10.5)
nRBC: 0 % (ref 0.0–0.2)

## 2024-05-30 LAB — LIPID PANEL
Cholesterol: 141 mg/dL (ref 0–200)
HDL: 43 mg/dL (ref >40)
LDL Cholesterol: 84 mg/dL (ref 0–99)
Total CHOL/HDL Ratio: 3.3 ratio
Triglycerides: 72 mg/dL (ref <150)
VLDL: 14 mg/dL (ref 0–40)

## 2024-05-30 LAB — HEMOGLOBIN A1C
Hgb A1c MFr Bld: 5.1 % (ref 4.8–5.6)
Mean Plasma Glucose: 99.67 mg/dL

## 2024-05-30 SURGERY — MRI WITH ANESTHESIA
Anesthesia: General

## 2024-05-30 MED ORDER — GLYCOPYRROLATE 0.2 MG/ML IJ SOLN
INTRAMUSCULAR | Status: DC | PRN
  Administered 2024-05-30 (×2): .1 mg via INTRAVENOUS

## 2024-05-30 MED ORDER — PROPOFOL 10 MG/ML IV BOLUS
INTRAVENOUS | Status: DC | PRN
  Administered 2024-05-30: 50 mg via INTRAVENOUS

## 2024-05-30 MED ORDER — PROPOFOL 10 MG/ML IV BOLUS
INTRAVENOUS | Status: AC
Start: 2024-05-30 — End: 2024-05-30
  Filled 2024-05-30: qty 20

## 2024-05-30 MED ORDER — LIDOCAINE 2% (20 MG/ML) 5 ML SYRINGE
INTRAMUSCULAR | Status: DC | PRN
  Administered 2024-05-30: 60 mg via INTRAVENOUS

## 2024-05-30 MED ORDER — PROPOFOL 500 MG/50ML IV EMUL
INTRAVENOUS | Status: DC | PRN
  Administered 2024-05-30: 100 ug/kg/min via INTRAVENOUS

## 2024-05-30 MED ORDER — MIDAZOLAM HCL (PF) 10 MG/2ML IJ SOLN
INTRAMUSCULAR | Status: AC
  Filled 2024-05-30: qty 2

## 2024-05-30 MED ORDER — ORAL CARE MOUTH RINSE: 15.0000 mL | Freq: Once | OROMUCOSAL | Status: DC

## 2024-05-30 MED ORDER — KETAMINE HCL 100 MG/ML IJ SOLN
INTRAMUSCULAR | Status: AC
  Filled 2024-05-30: qty 1

## 2024-05-30 MED ORDER — MIDAZOLAM HCL 2 MG/2ML IJ SOLN
INTRAMUSCULAR | Status: DC | PRN
  Administered 2024-05-30: 4 mg via INTRAMUSCULAR

## 2024-05-30 MED ORDER — ONDANSETRON HCL 4 MG/2ML IJ SOLN
INTRAMUSCULAR | Status: DC | PRN
  Administered 2024-05-30: 4 mg via INTRAVENOUS

## 2024-05-30 MED ORDER — DEXMEDETOMIDINE HCL IN NACL 80 MCG/20ML IV SOLN
INTRAVENOUS | Status: DC | PRN
Start: 2024-05-30 — End: 2024-05-30
  Administered 2024-05-30: 4 ug via INTRAVENOUS
  Administered 2024-05-30: 8 ug via INTRAVENOUS

## 2024-05-30 MED ORDER — DEXAMETHASONE SODIUM PHOSPHATE 10 MG/ML IJ SOLN
INTRAMUSCULAR | Status: DC | PRN
  Administered 2024-05-30: 5 mg via INTRAVENOUS

## 2024-05-30 MED ORDER — PHENYLEPHRINE 80 MCG/ML (10ML) SYRINGE FOR IV PUSH (FOR BLOOD PRESSURE SUPPORT)
PREFILLED_SYRINGE | INTRAVENOUS | Status: DC | PRN
  Administered 2024-05-30 (×5): 80 ug via INTRAVENOUS

## 2024-05-30 MED ORDER — SUGAMMADEX SODIUM 200 MG/2ML IV SOLN
INTRAVENOUS | Status: DC | PRN
  Administered 2024-05-30: 200 mg via INTRAVENOUS

## 2024-05-30 MED ORDER — CHLORHEXIDINE GLUCONATE 0.12 % MT SOLN
OROMUCOSAL | Status: AC
  Filled 2024-05-30: qty 15

## 2024-05-30 MED ORDER — CHLORHEXIDINE GLUCONATE 0.12 % MT SOLN: 15.0000 mL | Freq: Once | OROMUCOSAL | Status: DC

## 2024-05-30 MED ORDER — ONDANSETRON HCL 4 MG/2ML IJ SOLN: 4.0000 mg | Freq: Once | INTRAMUSCULAR | Status: DC | PRN

## 2024-05-30 MED ORDER — GADOBUTROL 1 MMOL/ML IV SOLN
6.0000 mL | Freq: Once | INTRAVENOUS | Status: AC | PRN
Start: 2024-05-30 — End: 2024-05-30
  Administered 2024-05-30: 6 mL via INTRAVENOUS

## 2024-05-30 MED ORDER — ROCURONIUM BROMIDE 10 MG/ML (PF) SYRINGE
PREFILLED_SYRINGE | INTRAVENOUS | Status: DC | PRN
  Administered 2024-05-30: 20 mg via INTRAVENOUS
  Administered 2024-05-30: 50 mg via INTRAVENOUS

## 2024-05-30 MED ORDER — LACTATED RINGERS IV SOLN: INTRAVENOUS | Status: DC

## 2024-05-30 MED ORDER — KETAMINE HCL 100 MG/ML IJ SOLN
INTRAMUSCULAR | Status: DC | PRN
  Administered 2024-05-30: 200 mg via INTRAMUSCULAR

## 2024-05-30 NOTE — Transfer of Care (Signed)
 Immediate Anesthesia Transfer of Care Note  Patient: Cameron Riley  Procedure(s) Performed: MRI WITH ANESTHESIA  Patient Location: PACU  Anesthesia Type:General  Level of Consciousness: drowsy  Airway & Oxygen Therapy: Patient Spontanous Breathing and Patient connected to nasal cannula oxygen  Post-op Assessment: Report given to RN and Post -op Vital signs reviewed and stable  Post vital signs: Reviewed and stable  Last Vitals:  Vitals Value Taken Time  BP 122/83 05/30/24 12:45  Temp    Pulse 80 05/30/24 12:56  Resp 22 05/30/24 12:56  SpO2 99 % 05/30/24 12:56  Vitals shown include unfiled device data.  Last Pain: There were no vitals filed for this visit.       Complications: No notable events documented.

## 2024-05-30 NOTE — Anesthesia Postprocedure Evaluation (Signed)
 Anesthesia Post Note  Patient: Cameron Riley  Procedure(s) Performed: MRI WITH ANESTHESIA     Patient location during evaluation: PACU Anesthesia Type: General Level of consciousness: awake and alert Pain management: pain level controlled Vital Signs Assessment: post-procedure vital signs reviewed and stable Respiratory status: spontaneous breathing, nonlabored ventilation and respiratory function stable Cardiovascular status: stable and blood pressure returned to baseline Anesthetic complications: no  No notable events documented.  Last Vitals:  Vitals:   05/30/24 1245 05/30/24 1300  BP: 122/83   Pulse: 70 78  Resp: 11 15  Temp: (!) 36.4 C (!) 36.4 C  SpO2: 96% 99%    Last Pain: There were no vitals filed for this visit.               Juventino Oppenheim

## 2024-05-30 NOTE — Anesthesia Procedure Notes (Addendum)
 Procedure Name: Intubation Date/Time: 05/30/2024 10:32 AM  Performed by: Kjersti Dittmer, CRNAPre-anesthesia Checklist: Patient identified, Emergency Drugs available, Suction available, Patient being monitored and Timeout performed Patient Re-evaluated:Patient Re-evaluated prior to induction Oxygen Delivery Method: Circle system utilized Preoxygenation: Pre-oxygenation with 100% oxygen Induction Type: Inhalational induction Ventilation: Mask ventilation without difficulty Laryngoscope Size: Mac and 4 Grade View: Grade I Tube type: Oral Tube size: 7.5 mm Number of attempts: 1 Placement Confirmation: ETT inserted through vocal cords under direct vision, positive ETCO2 and breath sounds checked- equal and bilateral Secured at: 22 cm Tube secured with: Tape Comments: Lips, teeth, and tongue unchanged. Head and neck midline.

## 2024-05-31 ENCOUNTER — Encounter (HOSPITAL_COMMUNITY): Payer: Self-pay | Admitting: Radiology

## 2024-05-31 ENCOUNTER — Ambulatory Visit (INDEPENDENT_AMBULATORY_CARE_PROVIDER_SITE_OTHER): Payer: Self-pay | Admitting: Pediatrics

## 2024-05-31 DIAGNOSIS — Q851 Tuberous sclerosis: Secondary | ICD-10-CM

## 2024-06-03 NOTE — Telephone Encounter (Signed)
 I called and spoke to a representative of the [facility] by the name of Tiffany.   Tiffany stated that the order was cancelled or discontinued on the date of the draw, but she was unable to tell me why.   Tiffany transferred me to her Supervisor by the name of Justine Oms. After further investigation, Justine Oms was able to gather and relay that the tube needed (Gold Top) to run this test was not drawn. She was unable to answer as to why that was because she (nor her team) were the ones to draw the lab.   I verbalized understanding of this.   SS, CCMA

## 2024-06-10 ENCOUNTER — Encounter (INDEPENDENT_AMBULATORY_CARE_PROVIDER_SITE_OTHER): Payer: Self-pay

## 2024-07-03 NOTE — Progress Notes (Signed)
 Patient: Cameron Riley MRN: [id number] Sex: male DOB: 11/11/2001  Provider: Corean Geralds, MD Location of Care: [facility]  Note type: Routine follow-up  History of Present Illness:  Cameron Riley is a 23 y.o. male with history of clinical diagnosis of Tuberous Sclerosis, focal epilepsy, and autism who I am seeing for routine follow-up. Patient was last seen on 01/08/2024 where I continued Lamictal , ordered Sirolimus  cream for his skin and planned to consider dermatology referral, and ordered a brain MRI, abdominal MRI, EKG, and labs to be done under sedation.  Since the last appointment, patient saw Dr. Lucio with [facility] psychiatry on 02/20/2024 where he increased Abilify, decreased risperdal, and continued clonazepam and sertraline. Patient had a sedated brain MRI, abdominal MRI, EKG, and labs on 05/30/2024. 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 He was referred to the TS clinic at [facility] on 05/31/2024.   Patient presents today with parents who reports the following:    Everything is going well. They hadn't been able to get Sirolimus  cream.   Parents have only heard from [facility] neurology. He has not seen ophthalmology since he was younger, mom does not think he would tolerate.   His behavior is unchanged. They are trying to get him off of risperdal because they were concerned with his prolactin level. When they adjust medications, he can have some trouble with behavior and sleep but he levels out.   No pain in his abdomen that mom has noticed.   Patient History:  His last seizure around 2020. semiology: eyes rolled back and freezing, they would last about a min or two.   Mom reports he has not seen an opthalmologist or a dermatologist recently.     Care management: Mom has guardianship.  He graduated from school in May, he has therapists that come to the house gets 84 hours of  nursing Cap-DA (about 54 hours goes to someone else) and respite. Recently got a new case worker through [employer]. He is not interested in any other programs.    Didn't have any problem with the tailored plan.  Still getting CAP   Behavior History Autism spectrum disorder, level 3, hyperactive, agitatation   Medication History Clonidine caused him to be more agitated  Diagnostics:  Abdomen MRI 05/30/2024 Impression: 1. Redemonstration of multiple bilateral renal angiomyolipomas with largest in the right kidney lower pole measuring up to 3.0 x 4.9 cm, which has mildly increased in size since the prior study. 2. Multiple other additional bilateral renal angiomyolipomas are also seen, which are also mildly increased in size since the prior study.  Brain MRI 05/30/2024 Impression:  Cortical/subcortical tubers and subependymal nodules, unchanged and compatible with the provided history of tuberous sclerosis. No enlarging subependymal nodules to suggest subependymal giant cell astrocytoma.  Repeat MRI and CT of abdomen scan were done in 07/01/2021.   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 Most are slightly increased in size since 2018. The largest in the right lower pole measures slightly smaller, 4.2 cm compared to 5.4 cm previously.   Past Medical History Past Medical History:  Diagnosis Date   Autism    Complication of anesthesia    PONV   PONV (postoperative nausea and vomiting)    Seizures (HCC)  last one 2021 (as of 06/30/21)   TS (tuberous sclerosis) [facility])     Surgical History Past Surgical History:  Procedure Laterality Date   KIDNEY SURGERY  06/2014   Performed at [facility]   MRI     history multiple MRIs   RADIOLOGY WITH ANESTHESIA N/A 05/08/2014   Procedure: MRI;  Surgeon: Medication  Radiologist, MD;  Location: [facility] OR;  Service: Radiology;  Laterality: N/A;   RADIOLOGY WITH ANESTHESIA N/A 07/13/2017   Procedure: MRI BRAIN WITH AND WITHOUT CONTRAST, CT OF RENAL ABDOMIN WITH AND WITHOUT;  Surgeon: Radiologist, Medication, MD;  Location: [facility] OR;  Service: Radiology;  Laterality: N/A;   RADIOLOGY WITH ANESTHESIA N/A 07/01/2021   Procedure: MRI WITH ANESTHESIA-BRAIN WITH AND WITHOUT;  Surgeon: Radiologist, Medication, MD;  Location: [facility] OR;  Service: Radiology;  Laterality: N/A;   RADIOLOGY WITH ANESTHESIA N/A 07/01/2021   Procedure: CT WITH ANESTHESIA-ABDOMEN AND PELVIS WITH CONTRAST;  Surgeon: Radiologist, Medication, MD;  Location: [facility] OR;  Service: Radiology;  Laterality: N/A;   RADIOLOGY WITH ANESTHESIA N/A 05/30/2024   Procedure: MRI WITH ANESTHESIA;  Surgeon: Radiologist, Medication, MD;  Location: [facility] OR;  Service: Radiology;  Laterality: N/A;    Family History family history includes Heart disease in his paternal grandfather.   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   He attends [employer].     He is not in a day program.    He lives with both parents and he has one brother who is 63 yo.    He enjoys swinging and being outside.     Allergies No Known Allergies  Medications Current Outpatient Medications on File Prior to Visit  Medication Sig Dispense Refill   ARIPiprazole (ABILIFY) 2 MG tablet Take 2 mg by mouth at bedtime.     ARIPiprazole (ABILIFY) 5 MG tablet Take 5 mg by mouth at bedtime.     clonazePAM (KLONOPIN) 1 MG tablet Take 0.5-1 mg by mouth See admin instructions. Take 0.5 mg in the morning and lunch and 1 mg at bedtime     IRON-VITAMIN C PO Take 1 tablet by mouth daily.     LAMICTAL  200 MG tablet TAKE 2 AND 1/2 TABLETS BY MOUTH EACH MORNING AND EACH NIGHT AT BEDTIME (Patient taking differently: Take 400 mg by mouth daily. Taking 400 Mg every morning; 500 Mg every evening) 155 tablet 12    risperiDONE (RISPERDAL) 1 MG tablet Take 1 mg by mouth daily.     Ferrous Gluconate-C-Folic Acid (IRON-C PO) Take 1 tablet by mouth daily. (Patient not taking: Reported on 07/08/2024)     risperiDONE (RISPERDAL) 2 MG tablet Take 2 mg by mouth at bedtime.     sertraline (ZOLOFT) 50 MG tablet Take 75 mg by mouth daily after lunch.     Current Facility-Administered Medications on File Prior to Visit  Medication Dose Route Frequency Provider Last Rate Last Admin   lactated ringers  infusion    Continuous PRN Gustabo Debby SQUIBB, CRNA   New Bag at 05/08/14 0815   The medication list was reviewed and reconciled. All changes or newly prescribed medications were explained.  A complete medication list was provided to the patient/caregiver.  Physical Exam Ht 5' 10.87 (1.8 m)   Wt 141 lb (64 kg)   BMI 19.74 kg/m  Facility age limit for growth %iles is 20 years.  No results found. 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 HEENT: Normocephalic, no dysmorphic features, no conjunctival injection, nares patent, mucous membranes moist, oropharynx clear. Neck: Supple, no meningismus. No focal tenderness. Resp: Clear to auscultation bilaterally CV: Regular rate, normal S1/S2, no murmurs, no rubs Abd: BS present, abdomen soft, non-tender, non-distended. No hepatosplenomegaly or mass Ext: Warm and well-perfused. No deformities, no muscle wasting, ROM full.   Neurological Examination: MS: Awake, alert, interactive. Answers pointed questoins with 1 word answers, happy and engaged.  Cranial Nerves: Pupils were equal and reactive to light;  EOM normal, no nystagmus; no ptsosis, no double vision, intact facial sensation, face symmetric with full strength of facial muscles, hearing intact grossly.  Motor-Normal tone throughout, Normal strength in all muscle groups. No abnormal movements Reflexes- Reflexes 2+ and symmetric in the biceps, triceps, patellar and achilles tendon.  Plantar responses flexor bilaterally, no clonus noted Sensation: Intact to light touch throughout.   Coordination: No dysmetria with reaching for objects Gait: Normal gait   Diagnosis: 1. Angiolipoma of kidney   2. Angiolipoma of skin   3. Tuberous sclerosis (HCC)   4. Partial epilepsy with impairment of consciousness (HCC)      Assessment and Plan DEWANE TIMSON is a 23 y.o. male with history of clinical diagnosis of Tuberous Sclerosis, focal epilepsy, and autism who I am seeing in follow-up. I reviewed recent imaging results with family.  Given continued growth of angiolipomas in kidneys, I recommend nephrology evaluation.  I am concerned for continued angiofibroma near eye, and I have not been able to get sirolimus  cream for patient due to insurance limitations. Patient howeveroverall doing well. Seizures are well controlled on current dose of medication so refilled today. I will continue to manage his neurologic care as there is no multidisciplinary clinic for TS adults at [facility]. Referred to [facility] nephrology and dermatology.   Continue Lamictal  450 mg BID as prescribed Continue other medications managed by psychiatry.  Referred to [facility] Nephrology and [facility] Dermatology Patient will [facility] neurology appointment.  I am happy to continue to manage his epilepsy and other neurologic concerns.    I spent 25 minutes on day of service on this patient including review of chart, discussion with patient and family, discussion of screening results, coordination with other providers and management of orders and paperwork. This time does not include does include any behavioral screenings, baclofen pump refills, or VNS interrogations.   Return in about 6 months (around 01/08/2025).  I, Earnie Brandy, scribed for and in the presence of Corean Geralds, MD at today's visit on 07/08/2024.  I, Corean Geralds MD MPH, personally performed the services described in this documentation, as scribed by Earnie Brandy in my  presence on 07/08/2024 and it is accurate, complete, and reviewed by me.     Corean Geralds MD MPH Neurology and Neurodevelopment [facility] Neurology  [address] [place], [place], [place] [address] Phone: [phone] Fax: [phone]

## 2024-07-08 ENCOUNTER — Encounter (INDEPENDENT_AMBULATORY_CARE_PROVIDER_SITE_OTHER): Payer: Self-pay | Admitting: Pediatrics

## 2024-07-08 ENCOUNTER — Ambulatory Visit (INDEPENDENT_AMBULATORY_CARE_PROVIDER_SITE_OTHER): Payer: MEDICAID | Admitting: Pediatrics

## 2024-07-08 VITALS — Ht 70.87 in | Wt 141.0 lb

## 2024-07-08 DIAGNOSIS — D1730 Benign lipomatous neoplasm of skin and subcutaneous tissue of unspecified sites: Secondary | ICD-10-CM | POA: Diagnosis not present

## 2024-07-08 DIAGNOSIS — Q851 Tuberous sclerosis: Secondary | ICD-10-CM

## 2024-07-08 DIAGNOSIS — D1771 Benign lipomatous neoplasm of kidney: Secondary | ICD-10-CM | POA: Diagnosis not present

## 2024-07-08 DIAGNOSIS — G40209 Localization-related (focal) (partial) symptomatic epilepsy and epileptic syndromes with complex partial seizures, not intractable, without status epilepticus: Secondary | ICD-10-CM

## 2024-07-08 NOTE — Patient Instructions (Addendum)
 Referred to [facility] Nephrology and [facility] Dermatology Continue Lamictal  as prescribed Please cancel [facility] neurology appointment with Neurology.  I am happy to continue to manage his epilepsy and other neurologic concerns.

## 2024-07-15 ENCOUNTER — Encounter (INDEPENDENT_AMBULATORY_CARE_PROVIDER_SITE_OTHER): Payer: Self-pay

## 2024-07-26 ENCOUNTER — Telehealth (INDEPENDENT_AMBULATORY_CARE_PROVIDER_SITE_OTHER): Payer: Self-pay | Admitting: Pediatrics

## 2024-07-26 NOTE — Telephone Encounter (Signed)
 Dawn called and said they received a referral for this patient, but he's 23 years old and they are a pediatric office. She was trying to get more info about the case. She can be reached directly at [phone]

## 2024-07-29 NOTE — Telephone Encounter (Signed)
 Contacted Dawn with [facility] Nephrology and Hypertension. Dawn stated that she was the [facility] referral coordinator and she'd forwarded the referral to the adult department.   I verbalized understanding of this.   SS, CCMA

## 2024-08-05 ENCOUNTER — Encounter (INDEPENDENT_AMBULATORY_CARE_PROVIDER_SITE_OTHER): Payer: Self-pay | Admitting: Pediatrics

## 2024-12-30 NOTE — Progress Notes (Incomplete)
 "  Patient: Cameron Riley MRN: [id number] Sex: male DOB: [birth year]  Provider: Corean Geralds, MD Location of Care: [facility]  Note type: Routine follow-up   This is a Pediatric Specialist E-Visit follow up consult provided via MyChart Cameron Riley and their parent/guardian Cameron Riley consented to an E-Visit consult today.  Location of patient: Garmon is at home in [place], [place] Location of provider: [facility] Geralds, MD [facility]  The following participants were involved in this E-Visit:  Corean Geralds, MD, Wells Barthel, CMA, Earnie Brandy, Scribe, Cameron Riley, patient, and their parent/guardian Cameron Riley  This visit was done via VIDEO    History of Present Illness:  Cameron Riley is a 24 y.o. male with history of clinical diagnosis of Tuberous Sclerosis, focal epilepsy, and autism who I am seeing for routine follow-up. Patient was last seen on 07/08/2024 where I continued Lamictal  and referred to [facility] nephrology and dermatology.  Since the last appointment, patient saw Dr. Zola with [facility] psychiatry on 08/27/2024 where they decreased risperidone. Patient saw Dr. Lucienne with [facility] nephrology on 12/25/2024 where he referred to urologic oncology. Patient has upcoming appointment with [facility] dermatology on 01/20/2025.  Patient presents today with mother who reports the following:    Things have been okay, he has slightly more agitated lately. They have increased his risperidone again. They see [facility] psychiatry next week.   He had a seizure two weeks ago for the first time in a long time. He didn't miss medication, no other clear seizures.   Nephrology was not very helpful as they were not experienced in tuberous sclerosis. Mom calling to confirm that dermatology will be able to provide care for [facility]. [facility] no longer doing tuberous sclerosis clinic.   He is not complaining of headaches or back pain. No breathing issues. No issues  with urination.   He is not sleeping well, planning to follow up with [facility] about it. He sometimes has trouble falling asleep, sometimes wakes up early and wants to start the day. This has started since they started risperidone wean.   CAP is going well. He stays home during the day with his parents.   Patient History:  His last seizure around January 2026.  semiology: eyes rolled back and freezing, they would last about a min or two.  Previous history: His last pair of seizures occurred on February 15, 2018. During then, he was noted to be grinding his teeth, eyes rolled up, the shoulders were tense. The episodes lasted 45 seconds and then he slept.    Mom reports he has not seen an opthalmologist or a dermatologist recently.     Care management: Mom has guardianship.  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 Recently got a new case worker through [employer]. He is not interested in any other programs.    Didn't have any problem with the tailored plan.  Still getting CAP   Behavior History Autism spectrum disorder, level 3, hyperactive, agitatation   Medication History Clonidine caused him to be more agitated   Diagnostics:  Abdomen MRI 05/30/2024 Impression: 1. Redemonstration of multiple bilateral renal angiomyolipomas with largest in the right kidney lower pole measuring up to 3.0 x 4.9 cm, which has mildly increased in size since the prior study. 2. Multiple other additional bilateral renal angiomyolipomas are  also seen, which are also mildly increased in size since the prior study.   Brain MRI 05/30/2024 Impression:  Cortical/subcortical tubers and subependymal nodules, unchanged and compatible with the provided history of tuberous sclerosis. No enlarging subependymal nodules to suggest subependymal giant cell astrocytoma.   Repeat MRI and CT  of abdomen scan were done in 07/01/2021.   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 Most are slightly increased in size since 2018. The largest in the right lower pole measures slightly smaller, 4.2 cm compared to 5.4 cm previously.   Past Medical History Past Medical History:  Diagnosis Date   Autism    Complication of anesthesia    PONV   PONV (postoperative nausea and vomiting)    Seizures (HCC)    last one 2021 (as of 06/30/21)   TS (tuberous sclerosis) [place])     Surgical History Past Surgical History:  Procedure Laterality Date   KIDNEY SURGERY  06/2014   Performed at [facility]   MRI     history multiple MRIs   RADIOLOGY WITH ANESTHESIA N/A 05/08/2014   Procedure: MRI;  Surgeon: Medication Radiologist, MD;  Location: [facility] OR;  Service: Radiology;  Laterality: N/A;   RADIOLOGY WITH ANESTHESIA N/A 07/13/2017   Procedure: MRI BRAIN WITH AND WITHOUT CONTRAST, CT OF RENAL ABDOMIN WITH AND WITHOUT;  Surgeon: Radiologist, Medication, MD;  Location: [facility] OR;  Service: Radiology;  Laterality: N/A;   RADIOLOGY WITH ANESTHESIA N/A 07/01/2021   Procedure: MRI WITH ANESTHESIA-BRAIN WITH AND WITHOUT;  Surgeon: Radiologist, Medication, MD;  Location: [facility] OR;  Service: Radiology;  Laterality: N/A;   RADIOLOGY WITH ANESTHESIA N/A 07/01/2021   Procedure: CT WITH ANESTHESIA-ABDOMEN AND PELVIS WITH CONTRAST;  Surgeon: Radiologist, Medication, MD;  Location: [facility] OR;  Service: Radiology;  Laterality: N/A;   RADIOLOGY WITH ANESTHESIA N/A 05/30/2024   Procedure: MRI WITH ANESTHESIA;  Surgeon: Radiologist, Medication, MD;  Location: [facility] OR;  Service: Radiology;  Laterality: N/A;    Family History family history includes Heart disease in his paternal grandfather.   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    He attends [employer].     He is not in a day program.    He lives with both parents and he has one brother who is 57 yo.    He enjoys swinging and being outside.     Allergies Allergies[1]  Medications Medications Ordered Prior to Encounter[2] The medication list was reviewed and reconciled. All changes or newly prescribed medications were explained.  A complete medication list was provided to the patient/caregiver.  Physical Exam There were no vitals taken for this visit. Facility age limit for growth %iles is 20 years.  No results found.  ***   Diagnosis:No diagnosis found.   Assessment and Plan RONTAVIOUS ALBRIGHT is a 24 y.o. male with history of clinical diagnosis of Tuberous Sclerosis, focal epilepsy, and autism who I am seeing in follow-up. Patient's seizures are well controlled on current dose of medication. If he has more seizures, can consider a new medication. Patient with upcoming appointment with [facility] dermatology. Can consider referral to [facility] tuberous sclerosis clinic if [facility] is unable to provide care related to his diagnosis.   Continue Lamictal  500 mg BID. Can consider changes if he has further seizures.  Recommended keeping upcoming dermatology appointment at  [facility]  I spent 15 minutes on day of service on this patient including review of chart, discussion with patient and family, discussion of screening results, coordination with other providers and management of orders and paperwork. This time does not include does include any behavioral screenings, baclofen pump refills, or VNS interrogations.    No follow-ups on file.  I, Earnie Brandy, scribed for and in the presence of Corean Geralds, MD at today's visit on 01/02/2025.   Corean Geralds MD MPH Neurology and Neurodevelopment [facility] Child Neurology  [address] [place], [place], [place] [address] Phone: [phone] Fax: [phone]      [1] No Known Allergies [2]  Current Outpatient  Medications on File Prior to Visit  Medication Sig Dispense Refill   ARIPiprazole (ABILIFY) 2 MG tablet Take 2 mg by mouth at bedtime.     ARIPiprazole (ABILIFY) 5 MG tablet Take 5 mg by mouth at bedtime.     clonazePAM (KLONOPIN) 1 MG tablet Take 0.5-1 mg by mouth See admin instructions. Take 0.5 mg in the morning and lunch and 1 mg at bedtime     Ferrous Gluconate-C-Folic Acid (IRON-C PO) Take 1 tablet by mouth daily. (Patient not taking: Reported on 07/08/2024)     IRON-VITAMIN C PO Take 1 tablet by mouth daily.     LAMICTAL  200 MG tablet TAKE 2 AND 1/2 TABLETS BY MOUTH EACH MORNING AND EACH NIGHT AT BEDTIME (Patient taking differently: Take 400 mg by mouth daily. Taking 400 Mg every morning; 500 Mg every evening) 155 tablet 12   risperiDONE (RISPERDAL) 1 MG tablet Take 1 mg by mouth daily.     risperiDONE (RISPERDAL) 2 MG tablet Take 2 mg by mouth at bedtime.     sertraline (ZOLOFT) 50 MG tablet Take 75 mg by mouth daily after lunch.     Current Facility-Administered Medications on File Prior to Visit  Medication Dose Route Frequency Provider Last Rate Last Admin   lactated ringers  infusion    Continuous PRN Gustabo Debby SQUIBB, CRNA   New Bag at 05/08/14 0815   "

## 2025-01-01 NOTE — Telephone Encounter (Signed)
 Duplicate request

## 2025-01-02 ENCOUNTER — Encounter (INDEPENDENT_AMBULATORY_CARE_PROVIDER_SITE_OTHER): Payer: Self-pay | Admitting: Pediatrics

## 2025-01-02 ENCOUNTER — Telehealth (INDEPENDENT_AMBULATORY_CARE_PROVIDER_SITE_OTHER): Payer: MEDICAID | Admitting: Pediatrics

## 2025-01-02 NOTE — Patient Instructions (Addendum)
 Continue Lamictal  at current dose.  If he has another seizure, let me know.  Let me know how the dermatology appointment goes.

## 2025-01-21 ENCOUNTER — Encounter (INDEPENDENT_AMBULATORY_CARE_PROVIDER_SITE_OTHER): Payer: Self-pay | Admitting: Pediatrics

## 2025-01-21 ENCOUNTER — Other Ambulatory Visit (INDEPENDENT_AMBULATORY_CARE_PROVIDER_SITE_OTHER): Payer: Self-pay | Admitting: Pediatrics

## 2025-01-21 DIAGNOSIS — G40209 Localization-related (focal) (partial) symptomatic epilepsy and epileptic syndromes with complex partial seizures, not intractable, without status epilepticus: Secondary | ICD-10-CM
